# Patient Record
Sex: Male | Born: 1960 | Race: White | Hispanic: No | Smoking: Current every day smoker
Health system: Southern US, Community
[De-identification: ages and names within clinical notes are randomized; demographics above are authoritative.]

## PROBLEM LIST (undated history)

## (undated) HISTORY — PX: SKIN GRAFT: SHX250

---

## 2007-06-09 ENCOUNTER — Emergency Department (HOSPITAL_COMMUNITY): Admission: EM | Admit: 2007-06-09 | Discharge: 2007-06-09 | Payer: Self-pay | Admitting: Emergency Medicine

## 2010-08-25 ENCOUNTER — Emergency Department: Payer: Self-pay | Admitting: Internal Medicine

## 2015-01-14 ENCOUNTER — Emergency Department: Payer: Self-pay

## 2015-01-14 ENCOUNTER — Encounter: Payer: Self-pay | Admitting: Emergency Medicine

## 2015-01-14 ENCOUNTER — Inpatient Hospital Stay: Payer: Self-pay

## 2015-01-14 ENCOUNTER — Inpatient Hospital Stay
Admission: EM | Admit: 2015-01-14 | Discharge: 2015-01-18 | DRG: 208 | Disposition: A | Discharge status: Home / Self Care | Payer: Self-pay | Attending: Internal Medicine | Admitting: Internal Medicine

## 2015-01-14 DIAGNOSIS — Z0189 Encounter for other specified special examinations: Secondary | ICD-10-CM

## 2015-01-14 DIAGNOSIS — K7010 Alcoholic hepatitis without ascites: Secondary | ICD-10-CM | POA: Diagnosis present

## 2015-01-14 DIAGNOSIS — S0191XA Laceration without foreign body of unspecified part of head, initial encounter: Secondary | ICD-10-CM | POA: Diagnosis present

## 2015-01-14 DIAGNOSIS — F17210 Nicotine dependence, cigarettes, uncomplicated: Secondary | ICD-10-CM | POA: Diagnosis present

## 2015-01-14 DIAGNOSIS — G9340 Encephalopathy, unspecified: Secondary | ICD-10-CM | POA: Diagnosis present

## 2015-01-14 DIAGNOSIS — R0681 Apnea, not elsewhere classified: Secondary | ICD-10-CM

## 2015-01-14 DIAGNOSIS — F10121 Alcohol abuse with intoxication delirium: Secondary | ICD-10-CM | POA: Diagnosis present

## 2015-01-14 DIAGNOSIS — E871 Hypo-osmolality and hyponatremia: Secondary | ICD-10-CM | POA: Diagnosis present

## 2015-01-14 DIAGNOSIS — E860 Dehydration: Secondary | ICD-10-CM | POA: Diagnosis present

## 2015-01-14 DIAGNOSIS — F10929 Alcohol use, unspecified with intoxication, unspecified: Secondary | ICD-10-CM | POA: Diagnosis present

## 2015-01-14 DIAGNOSIS — R451 Restlessness and agitation: Secondary | ICD-10-CM | POA: Diagnosis present

## 2015-01-14 DIAGNOSIS — J9811 Atelectasis: Secondary | ICD-10-CM | POA: Diagnosis present

## 2015-01-14 DIAGNOSIS — R740 Nonspecific elevation of levels of transaminase and lactic acid dehydrogenase [LDH]: Secondary | ICD-10-CM | POA: Diagnosis present

## 2015-01-14 DIAGNOSIS — Y908 Blood alcohol level of 240 mg/100 ml or more: Secondary | ICD-10-CM | POA: Diagnosis present

## 2015-01-14 DIAGNOSIS — J9601 Acute respiratory failure with hypoxia: Principal | ICD-10-CM | POA: Diagnosis present

## 2015-01-14 DIAGNOSIS — W19XXXA Unspecified fall, initial encounter: Secondary | ICD-10-CM | POA: Diagnosis present

## 2015-01-14 DIAGNOSIS — F10129 Alcohol abuse with intoxication, unspecified: Secondary | ICD-10-CM

## 2015-01-14 DIAGNOSIS — I959 Hypotension, unspecified: Secondary | ICD-10-CM | POA: Diagnosis present

## 2015-01-14 DIAGNOSIS — R410 Disorientation, unspecified: Secondary | ICD-10-CM | POA: Diagnosis present

## 2015-01-14 LAB — BLOOD GAS, ARTERIAL
Acid-base deficit: 3.7 mmol/L — ABNORMAL HIGH (ref 0.0–2.0)
Acid-base deficit: 0.6 mmol/L (ref 0.0–2.0)
Allens test (pass/fail): POSITIVE — AB
BICARBONATE: 22.1 meq/L (ref 21.0–28.0)
BICARBONATE: 25.4 meq/L (ref 21.0–28.0)
FIO2: 0.35
FIO2: 0.35
LHR: 14 {breaths}/min
MECHANICAL RATE: 14
MECHVT: 500 mL
MECHVT: 500 mL
O2 SAT: 99.5 %
O2 Saturation: 98.8 %
PATIENT TEMPERATURE: 37
PCO2 ART: 42 mmHg (ref 32.0–48.0)
PEEP/CPAP: 5 cmH2O
PEEP/CPAP: 5 cmH2O
PO2 ART: 173 mmHg — AB (ref 83.0–108.0)
Patient temperature: 37
RATE: 14 resp/min
pCO2 arterial: 46 mmHg (ref 32.0–48.0)
pH, Arterial: 7.33 — ABNORMAL LOW (ref 7.350–7.450)
pH, Arterial: 7.35 (ref 7.350–7.450)
pO2, Arterial: 130 mmHg — ABNORMAL HIGH (ref 83.0–108.0)

## 2015-01-14 LAB — COMPREHENSIVE METABOLIC PANEL
ALT: 96 U/L — AB (ref 17–63)
AST: 77 U/L — AB (ref 15–41)
Albumin: 4.3 g/dL (ref 3.5–5.0)
Alkaline Phosphatase: 135 U/L — ABNORMAL HIGH (ref 38–126)
Anion gap: 8 (ref 5–15)
BUN: 12 mg/dL (ref 6–20)
CHLORIDE: 103 mmol/L (ref 101–111)
CO2: 23 mmol/L (ref 22–32)
CREATININE: 1.09 mg/dL (ref 0.61–1.24)
Calcium: 8.5 mg/dL — ABNORMAL LOW (ref 8.9–10.3)
GFR calc Af Amer: >60 mL/min (ref >60)
GFR calc non Af Amer: >60 mL/min (ref >60)
Glucose, Bld: 105 mg/dL — ABNORMAL HIGH (ref 65–99)
Potassium: 4 mmol/L (ref 3.5–5.1)
SODIUM: 134 mmol/L — AB (ref 135–145)
Total Bilirubin: 0.3 mg/dL (ref 0.3–1.2)
Total Protein: 8.7 g/dL — ABNORMAL HIGH (ref 6.5–8.1)

## 2015-01-14 LAB — CBC WITH DIFFERENTIAL/PLATELET
BASOS ABS: 0.1 10*3/uL (ref 0–0.1)
BASOS PCT: 1 %
EOS ABS: 0.4 10*3/uL (ref 0–0.7)
EOS PCT: 5 %
HCT: 40.3 % (ref 40.0–52.0)
Hemoglobin: 14.1 g/dL (ref 13.0–18.0)
LYMPHS PCT: 39 %
Lymphs Abs: 3.3 10*3/uL (ref 1.0–3.6)
MCH: 33.2 pg (ref 26.0–34.0)
MCHC: 35 g/dL (ref 32.0–36.0)
MCV: 95 fL (ref 80.0–100.0)
Monocytes Absolute: 0.8 10*3/uL (ref 0.2–1.0)
Monocytes Relative: 9 %
Neutro Abs: 3.8 10*3/uL (ref 1.4–6.5)
Neutrophils Relative %: 46 %
PLATELETS: 131 10*3/uL — AB (ref 150–440)
RBC: 4.25 MIL/uL — AB (ref 4.40–5.90)
RDW: 12.5 % (ref 11.5–14.5)
WBC: 8.4 10*3/uL (ref 3.8–10.6)

## 2015-01-14 LAB — URINALYSIS COMPLETE WITH MICROSCOPIC (ARMC ONLY)
BILIRUBIN URINE: NEGATIVE
GLUCOSE, UA: NEGATIVE mg/dL
KETONES UR: NEGATIVE mg/dL
Nitrite: NEGATIVE
PH: 6 (ref 5.0–8.0)
Protein, ur: NEGATIVE mg/dL
RBC / HPF: NONE SEEN RBC/hpf (ref 0–5)
SQUAMOUS EPITHELIAL / LPF: NONE SEEN
Specific Gravity, Urine: 1.003 — ABNORMAL LOW (ref 1.005–1.030)

## 2015-01-14 LAB — MRSA PCR SCREENING: MRSA BY PCR: NEGATIVE

## 2015-01-14 LAB — AMMONIA: AMMONIA: 30 umol/L (ref 9–35)

## 2015-01-14 LAB — TRIGLYCERIDES: Triglycerides: 179 mg/dL — ABNORMAL HIGH (ref <150)

## 2015-01-14 LAB — ETHANOL: Alcohol, Ethyl (B): 397 mg/dL (ref <5)

## 2015-01-14 LAB — MAGNESIUM: MAGNESIUM: 2.1 mg/dL (ref 1.7–2.4)

## 2015-01-14 LAB — LIPASE, BLOOD: LIPASE: 39 U/L (ref 22–51)

## 2015-01-14 LAB — PHOSPHORUS: Phosphorus: 4.3 mg/dL (ref 2.5–4.6)

## 2015-01-14 MED ORDER — ACETAMINOPHEN 325 MG PO TABS
650.0000 mg | ORAL_TABLET | Freq: Four times a day (QID) | ORAL | Status: DC | PRN
  Administered 2015-01-16: 650 mg via ORAL
  Filled 2015-01-14: qty 2

## 2015-01-14 MED ORDER — LORAZEPAM 2 MG/ML IJ SOLN
2.0000 mg | Freq: Once | INTRAMUSCULAR | Status: AC
  Administered 2015-01-14: 2 mg via INTRAVENOUS

## 2015-01-14 MED ORDER — SODIUM CHLORIDE 0.9 % IV BOLUS (SEPSIS): 1000.0000 mL | Freq: Once | INTRAVENOUS | Status: DC

## 2015-01-14 MED ORDER — PANTOPRAZOLE SODIUM 40 MG IV SOLR: 40.0000 mg | Freq: Every day | INTRAVENOUS | Status: DC

## 2015-01-14 MED ORDER — ONDANSETRON HCL 4 MG/2ML IJ SOLN: 4.0000 mg | Freq: Four times a day (QID) | INTRAMUSCULAR | Status: DC | PRN

## 2015-01-14 MED ORDER — LORAZEPAM 2 MG/ML IJ SOLN
2.0000 mg | INTRAMUSCULAR | Status: DC | PRN
  Administered 2015-01-14 – 2015-01-15 (×5): 2 mg via INTRAVENOUS
  Filled 2015-01-14 (×5): qty 1

## 2015-01-14 MED ORDER — ANTISEPTIC ORAL RINSE SOLUTION (CORINZ)
7.0000 mL | OROMUCOSAL | Status: DC
Start: 2015-01-14 — End: 2015-01-15
  Administered 2015-01-14 – 2015-01-15 (×11): 7 mL via OROMUCOSAL
  Filled 2015-01-14 (×20): qty 7

## 2015-01-14 MED ORDER — PROPOFOL 10 MG/ML IV BOLUS
0.5000 mg/kg | Freq: Once | INTRAVENOUS | Status: AC
  Administered 2015-01-14: 06:00:00 via INTRAVENOUS

## 2015-01-14 MED ORDER — ACETAMINOPHEN 650 MG RE SUPP: 650.0000 mg | Freq: Four times a day (QID) | RECTAL | Status: DC | PRN

## 2015-01-14 MED ORDER — HALOPERIDOL LACTATE 5 MG/ML IJ SOLN
INTRAMUSCULAR | Status: AC
  Administered 2015-01-14: 5 mg via INTRAVENOUS
  Filled 2015-01-14: qty 1

## 2015-01-14 MED ORDER — HALOPERIDOL LACTATE 5 MG/ML IJ SOLN
5.0000 mg | Freq: Once | INTRAMUSCULAR | Status: AC
  Administered 2015-01-14: 5 mg via INTRAVENOUS

## 2015-01-14 MED ORDER — PROPOFOL 1000 MG/100ML IV EMUL: 5.0000 ug/kg/min | INTRAVENOUS | Status: DC

## 2015-01-14 MED ORDER — ETOMIDATE 2 MG/ML IV SOLN
20.0000 mg | Freq: Once | INTRAVENOUS | Status: AC
  Administered 2015-01-14: 20 mg via INTRAVENOUS

## 2015-01-14 MED ORDER — SUCCINYLCHOLINE CHLORIDE 20 MG/ML IJ SOLN
200.0000 mg | Freq: Once | INTRAMUSCULAR | Status: AC
Start: 2015-01-14 — End: 2015-01-14
  Administered 2015-01-14: 200 mg via INTRAVENOUS

## 2015-01-14 MED ORDER — SODIUM CHLORIDE 0.9 % IV BOLUS (SEPSIS)
1000.0000 mL | Freq: Once | INTRAVENOUS | Status: AC
  Administered 2015-01-14: 1000 mL via INTRAVENOUS

## 2015-01-14 MED ORDER — PROPOFOL 1000 MG/100ML IV EMUL
5.0000 ug/kg/min | INTRAVENOUS | Status: DC
  Administered 2015-01-14: 40 ug/kg/min via INTRAVENOUS
  Administered 2015-01-14: 50 ug/kg/min via INTRAVENOUS
  Administered 2015-01-14: 60 ug/kg/min via INTRAVENOUS
  Administered 2015-01-14: 35 ug/kg/min via INTRAVENOUS
  Administered 2015-01-15: 60 ug/kg/min via INTRAVENOUS
  Filled 2015-01-14 (×5): qty 100

## 2015-01-14 MED ORDER — FENTANYL CITRATE (PF) 100 MCG/2ML IJ SOLN
100.0000 ug | INTRAMUSCULAR | Status: DC | PRN
  Administered 2015-01-14 – 2015-01-15 (×3): 100 ug via INTRAVENOUS
  Filled 2015-01-14 (×3): qty 2

## 2015-01-14 MED ORDER — SODIUM CHLORIDE 0.9 % IJ SOLN
3.0000 mL | Freq: Two times a day (BID) | INTRAMUSCULAR | Status: DC
  Administered 2015-01-14 – 2015-01-17 (×7): 3 mL via INTRAVENOUS

## 2015-01-14 MED ORDER — FENTANYL 2500MCG IN NS 250ML (10MCG/ML) PREMIX INFUSION: 10.0000 ug/h | INTRAVENOUS | Status: DC

## 2015-01-14 MED ORDER — DEXMEDETOMIDINE HCL IN NACL 400 MCG/100ML IV SOLN
0.2000 ug/kg/h | INTRAVENOUS | Status: DC
  Filled 2015-01-14: qty 50

## 2015-01-14 MED ORDER — LORAZEPAM 2 MG/ML IJ SOLN
0.5000 mg | Freq: Once | INTRAMUSCULAR | Status: AC
  Administered 2015-01-14: 0.5 mg via INTRAVENOUS
  Filled 2015-01-14: qty 1

## 2015-01-14 MED ORDER — ENOXAPARIN SODIUM 40 MG/0.4ML ~~LOC~~ SOLN
40.0000 mg | SUBCUTANEOUS | Status: DC
  Administered 2015-01-14 – 2015-01-18 (×5): 40 mg via SUBCUTANEOUS
  Filled 2015-01-14 (×5): qty 0.4

## 2015-01-14 MED ORDER — LORAZEPAM 2 MG/ML IJ SOLN
INTRAMUSCULAR | Status: AC
  Administered 2015-01-14: 2 mg via INTRAVENOUS
  Filled 2015-01-14: qty 1

## 2015-01-14 MED ORDER — PANTOPRAZOLE SODIUM 40 MG IV SOLR
40.0000 mg | Freq: Every day | INTRAVENOUS | Status: DC
  Administered 2015-01-15 – 2015-01-17 (×3): 40 mg via INTRAVENOUS
  Filled 2015-01-14 (×4): qty 40

## 2015-01-14 MED ORDER — THIAMINE HCL 100 MG/ML IJ SOLN
Freq: Once | INTRAVENOUS | Status: AC
  Administered 2015-01-14: 13:00:00 via INTRAVENOUS
  Filled 2015-01-14: qty 1000

## 2015-01-14 MED ORDER — ONDANSETRON HCL 4 MG PO TABS: 4.0000 mg | ORAL_TABLET | Freq: Four times a day (QID) | ORAL | Status: DC | PRN

## 2015-01-14 MED ORDER — PROPOFOL 1000 MG/100ML IV EMUL
INTRAVENOUS | Status: AC
  Filled 2015-01-14: qty 100

## 2015-01-14 MED ORDER — CHLORHEXIDINE GLUCONATE 0.12% ORAL RINSE (MEDLINE KIT)
15.0000 mL | Freq: Two times a day (BID) | OROMUCOSAL | Status: DC
  Administered 2015-01-14 – 2015-01-15 (×3): 15 mL via OROMUCOSAL
  Filled 2015-01-14 (×2): qty 15

## 2015-01-14 MED ORDER — TETANUS-DIPHTH-ACELL PERTUSSIS 5-2.5-18.5 LF-MCG/0.5 IM SUSP: 0.5000 mL | Freq: Once | INTRAMUSCULAR | Status: DC

## 2015-01-14 NOTE — ED Notes (Signed)
Pt with longer periods of apnea, sats decreased to 45%;  No gag reflex noted; NRB placed at 100%; ambu bag at bedside; BP 84/49; MD notified

## 2015-01-14 NOTE — ED Notes (Signed)
Ret to room 5 for closer observation to nurses desk; pt with eyes closed, occasionally attempting to sit up in bed then fall back supine with snoring respiration

## 2015-01-14 NOTE — ED Notes (Signed)
Nurse Bill in room with patient at this time.

## 2015-01-14 NOTE — ED Notes (Signed)
Pt now resting quietly; resp even/unlab; transported to xray and CT

## 2015-01-14 NOTE — ED Notes (Addendum)
Pt uprite on stretcher in exam room with no distress noted; pt talkative and cooperative; admits to drinking 12pack tonight and falling few times; denies any c/o pain at present; pt instructed to call for assistance in getting OOB; pt voices good understanding; abrasion noted to occipitut with scant bleeding; site clensed; no c/o pain or tenderness with palpation of neck or back; pt A&Ox3, MAEW, PERRL; resp even/unlab; no other injuries noted or reported; pt admits to drinking ETOH daily and is homeless; denies desire to seek detox at this time; fall bracelet and yellow fall slippers placed on pt for alert an call bell in reach; c-collar placed

## 2015-01-14 NOTE — ED Notes (Signed)
When assessing pt, propofol gtt @ 44mcg/kg/min. This is more than ordered dose. Dr sung notified. Gtt decreased to 28mcg/kg/min due to bp. See note. MAR changed to refect current dose.

## 2015-01-14 NOTE — Progress Notes (Addendum)
[facility] - [place] at [facility]   PATIENT NAME: Aaron Roach    MR#:  [id number]  DATE OF BIRTH:  [birth year]  SUBJECTIVE:  CHIEF COMPLAINT:   Chief Complaint  Patient presents with  . Fall    Pt. here via EMS from home for a fall laceration to top of head.   patient is 54 year old Caucasian male with history of alcohol abuse who was brought by EMS for fall and laceration of the top of his head. CT scan of the head and neck were unremarkable. As patient was agitated. He received a few small doses of Ativan as well as Haldol and became lethargic, hypoxic , apneic and was intubated. Remains intubated and off sedation. However, not able to follow commands, not opening eyes and is agitated, reaching to the tube site. Sedation was restarted.   Review of Systems  Unable to perform ROS: intubated    VITAL SIGNS: Blood pressure 132/83, pulse 75, temperature 99 F (37.2 C), temperature source Oral, resp. rate 14, height 5\' 6"  (1.676 m), weight 63.504 kg (140 lb), SpO2 100 %.  PHYSICAL EXAMINATION:   GENERAL:  54 y.o.-year-old patient lying in the bed in mild to moderate distress, not opening eyes, does not follow commands, however, able to go lift up his arms reach up to the tube was attempted to take it out.  EYES: Pupils equal, round, reactive to light and accommodation. No scleral icterus. Extraocular muscles intact.  HEENT: Head reveals laceration on the vertex, no bleeding, some dry blood was noted, normocephalic. Oropharynx and nasopharynx clear. ET tube is in place NECK:  Supple, no jugular venous distention. No thyroid enlargement, no tenderness.  LUNGS: Slightly diminished breath sounds bilaterally, no wheezing, bilateral rales,rhonchi  in all lung fields but no crepitations. ET tube secretions are thin and clear.  Intermittently using accessory muscles of respiration, especially when agitated.  CARDIOVASCULAR: S1, S2 normal. No murmurs, rubs, or gallops.   ABDOMEN: Soft, nontender, nondistended. Bowel sounds present. No organomegaly or mass.  EXTREMITIES: No pedal edema, cyanosis, or clubbing.  NEUROLOGIC: Cranial nerves II through XII are intact grossly. Muscle strength 5/5 in upper extremities, not able to evaluated lower extremities due to poor mobility. Sensation not able to evaluate. Gait not checked.  PSYCHIATRIC: The patient is somnolent , does not open eyes. Does not verbalize agitated intermittently, does not follow commands , not able to assess orientation .  SKIN: No obvious rash, lesion, or ulcer.   ORDERS/RESULTS REVIEWED:   CBC  Recent Labs Lab 01/14/15 0230  WBC 8.4  HGB 14.1  HCT 40.3  PLT 131*  MCV 95.0  MCH 33.2  MCHC 35.0  RDW 12.5  LYMPHSABS 3.3  MONOABS 0.8  EOSABS 0.4  BASOSABS 0.1   ------------------------------------------------------------------------------------------------------------------  Chemistries   Recent Labs Lab 01/14/15 0230  NA 134*  K 4.0  CL 103  CO2 23  GLUCOSE 105*  BUN 12  CREATININE 1.09  CALCIUM 8.5*  MG 2.1  AST 77*  ALT 96*  ALKPHOS 135*  BILITOT 0.3   ------------------------------------------------------------------------------------------------------------------ estimated creatinine clearance is 69.6 mL/min (by C-G formula based on Cr of 1.09). ------------------------------------------------------------------------------------------------------------------ No results for input(s): TSH, T4TOTAL, T3FREE, THYROIDAB in the last 72 hours.  Invalid input(s): FREET3  Cardiac Enzymes No results for input(s): CKMB, TROPONINI, MYOGLOBIN in the last 168 hours.  Invalid input(s): CK ------------------------------------------------------------------------------------------------------------------ Invalid input(s): POCBNP ---------------------------------------------------------------------------------------------------------------  RADIOLOGY: Dg Lumbar Spine  Complete  01/14/2015   CLINICAL DATA:  Low back  pain after multiple falls while drinking.  EXAM: LUMBAR SPINE - COMPLETE 4+ VIEW  COMPARISON:  None.  FINDINGS: Normal alignment of the lumbar spine. No vertebral compression deformities. Diffuse degenerative change with narrowed disc space heights and endplate hypertrophic changes. Degenerative disc disease at L4-5 and L5-S1. No focal bone lesion or bone destruction.  IMPRESSION: Degenerative changes in the lumbar spine. Normal alignment. No acute displaced fractures identified.   Electronically Signed   By: Burman Nieves M.D.   On: 01/14/2015 04:26   Dg Abd 1 View  01/14/2015   CLINICAL DATA:  54 year old male status post enteric tube placement. Initial encounter.  EXAM: ABDOMEN - 1 VIEW  COMPARISON:  Lumbar radiographs 0356 hours today.  FINDINGS: AP supine view of the abdomen at 1154 hours. Enteric tube placed, side hole at the level of the gastric fundus. Stable, negative bowel gas pattern. Negative visualized lung bases. Stable visualized osseous structures.  IMPRESSION: Enteric tube side hole at the level of the proximal stomach.   Electronically Signed   By: Odessa Fleming M.D.   On: 01/14/2015 12:24   Ct Head Wo Contrast  01/14/2015   CLINICAL DATA:  Fall with head injury.  Intoxicated.  EXAM: CT HEAD WITHOUT CONTRAST  CT CERVICAL SPINE WITHOUT CONTRAST  TECHNIQUE: Multidetector CT imaging of the head and cervical spine was performed following the standard protocol without intravenous contrast. Multiplanar CT image reconstructions of the cervical spine were also generated.  COMPARISON:  None.  FINDINGS: CT HEAD FINDINGS  Examination is technically limited due to motion artifact. Ventricles and sulci appear symmetrical. No mass effect or midline shift. No abnormal extra-axial fluid collections. Gray-white matter junctions are distinct. Basal cisterns are not effaced. No evidence of acute intracranial hemorrhage. No depressed skull fractures. Near complete  opacification of the right maxillary antrum.  CT CERVICAL SPINE FINDINGS  Examination is limited due to motion artifact. Normal alignment of the cervical spine. Degenerative changes with narrowed interspaces and endplate hypertrophic changes predominantly at C3-4, C4-5, and C5-6 levels. No vertebral compression deformities. No prevertebral soft tissue swelling. C1-2 articulation appears intact. No focal bone lesion or bone destruction. Bone cortex and trabecular architecture appear intact. Vascular calcifications in the cervical carotid arteries.  IMPRESSION: No acute intracranial abnormalities. Opacification of right maxillary antrum.  Normal alignment of the cervical spine. Degenerative changes. No acute displaced fractures identified.   Electronically Signed   By: Burman Nieves M.D.   On: 01/14/2015 04:38   Ct Cervical Spine Wo Contrast  01/14/2015   CLINICAL DATA:  Fall with head injury.  Intoxicated.  EXAM: CT HEAD WITHOUT CONTRAST  CT CERVICAL SPINE WITHOUT CONTRAST  TECHNIQUE: Multidetector CT imaging of the head and cervical spine was performed following the standard protocol without intravenous contrast. Multiplanar CT image reconstructions of the cervical spine were also generated.  COMPARISON:  None.  FINDINGS: CT HEAD FINDINGS  Examination is technically limited due to motion artifact. Ventricles and sulci appear symmetrical. No mass effect or midline shift. No abnormal extra-axial fluid collections. Gray-white matter junctions are distinct. Basal cisterns are not effaced. No evidence of acute intracranial hemorrhage. No depressed skull fractures. Near complete opacification of the right maxillary antrum.  CT CERVICAL SPINE FINDINGS  Examination is limited due to motion artifact. Normal alignment of the cervical spine. Degenerative changes with narrowed interspaces and endplate hypertrophic changes predominantly at C3-4, C4-5, and C5-6 levels. No vertebral compression deformities. No prevertebral  soft tissue swelling. C1-2 articulation appears intact. No focal bone  lesion or bone destruction. Bone cortex and trabecular architecture appear intact. Vascular calcifications in the cervical carotid arteries.  IMPRESSION: No acute intracranial abnormalities. Opacification of right maxillary antrum.  Normal alignment of the cervical spine. Degenerative changes. No acute displaced fractures identified.   Electronically Signed   By: Burman Nieves M.D.   On: 01/14/2015 04:38   Dg Chest Port 1 View  01/14/2015   CLINICAL DATA:  Post intubation.  EXAM: PORTABLE CHEST - 1 VIEW  COMPARISON:  None.  FINDINGS: Endotracheal tube tip projects above the level of the clavicles, 8.4 cm above the carina. Nasogastric tube past the GE junction, side port projecting at or just below the GE junction. Cardiomediastinal silhouette is normal. The lungs are clear without pleural effusions or focal consolidations. Bilateral lung base strandy densities. Trachea projects midline and there is no pneumothorax. Soft tissue planes and included osseous structures are non-suspicious.  IMPRESSION: Endotracheal tube tip projects 8.4 cm above the carina, recommend at least 3 cm of advancement. Nasogastric tube past GE junction.  Bilateral lung base atelectasis/scarring.   Electronically Signed   By: Awilda Metro M.D.   On: 01/14/2015 06:47    EKG: No orders found for this or any previous visit.  ASSESSMENT AND PLAN:  Principal Problem:   Acute respiratory failure with hypoxemia Active Problems:   Alcohol intoxication   Acute delirium 1. Acute delirium  due to alcohol intoxication, now comatose , watch for DTs as patient is being managed in the hospital, supportive therapy, get ammonia level to rule out hepatic encephalopathy 2. Acute respiratory failure with hypoxia, likely due to alcohol intoxication in combination with sedating medications, continue sedation with propofol per pulmonologist's recommendations. Continue  mechanical ventilation. Follow patient's mental status closely and extubate him as possible. Chest x-ray showed no congestive heart failure or pneumonia, but atelectasis and scarring 3. Hyponatremia, questionable  Chronic, as patient's urine specific gravity is low,  follow tomorrow in the morning, continue low rate IV fluids 4. Elevated transaminases, likely alcoholic hepatitis versus infectious hepatitis, follow liver function test in the morning. Patient's lipase level is normal, get ammonia level .    Management plans discussed with the patient, family and they are in agreement.   DRUG ALLERGIES: No Known Allergies  CODE STATUS:     Code Status Orders        Start     Ordered   01/14/15 0901  Full code   Continuous     01/14/15 0900      TOTAL CRITICAL CARE TIME TAKING CARE OF THIS PATIENT: 45  minutes.  The patient's management was discussed with pulmonologist/ intensivist, Dr. Belia Heman.   Katharina Caper M.D on 01/14/2015 at 1:48 PM  Between 7am to 6pm - Pager - [phone]  After 6pm go to [email or web address] - password [email or web address] [facility]  [place] [facility]  Office  [phone]  CC: Primary care physician; No primary care provider on file.

## 2015-01-14 NOTE — Consult Note (Signed)
[place] [facility] Consultation      Name: Aaron Roach MRN: [id number] DOB: 03/25/1961    ADMISSION DATE:  01/14/2015   CHIEF COMPLAINT:   resp failure   HISTORY OF PRESENT ILLNESS   54 yo white admitted to ICU for acute resp failure, ETOH level above 350, patient with acute mental status changes found to  Be hypoxic with low blood pressure, patient intubated and placed on vent support  Supposedly patient was combative and was given several doses of ativan at which point patient became hypoxic  Patient currently sedated, GCS<8T  S/p head laceration to top of head       SIGNIFICANT EVENTS    Intubated 9/5    PAST MEDICAL HISTORY    :  History reviewed. No pertinent past medical history. Past Surgical History  Procedure Laterality Date  . Skin graft     Prior to Admission medications   Not on File   No Known Allergies   FAMILY HISTORY   History reviewed. No pertinent family history.    SOCIAL HISTORY    reports that he has been smoking Cigarettes.  He has been smoking about 0.50 packs per day. He does not have any smokeless tobacco history on file. He reports that he drinks about 3.6 oz of alcohol per week. He reports that he does not use illicit drugs.  Review of Systems  Unable to perform ROS: critical illness      VITAL SIGNS    Temp:  [97.6 F (36.4 C)-98.3 F (36.8 C)] 98.3 F (36.8 C) (09/05 0900) Pulse Rate:  [63-97] 80 (09/05 0900) Resp:  [13-22] 13 (09/05 0900) BP: (84-153)/(49-105) 124/77 mmHg (09/05 0900) SpO2:  [94 %-100 %] 100 % (09/05 0900) FiO2 (%):  [35 %] 35 % (09/05 0831) Weight:  [140 lb (63.504 kg)] 140 lb (63.504 kg) (09/05 0137) HEMODYNAMICS:   VENTILATOR SETTINGS: Vent Mode:  [-] AC FiO2 (%):  [35 %] 35 % Set Rate:  [14 bmp] 14 bmp Vt Set:  [500 mL] 500 mL PEEP:  [5 cmH20] 5 cmH20 INTAKE / OUTPUT:  Intake/Output Summary (Last 24 hours) at 01/14/15 0937 Last data filed at 01/14/15 0608  Gross per 24 hour  Intake      0 ml  Output   1100 ml  Net  -1100 ml       PHYSICAL EXAM   Physical Exam  Constitutional: He appears distressed.  HENT:  Head: Normocephalic and atraumatic.  Eyes: Pupils are equal, round, and reactive to light. No scleral icterus.  Neck: Normal range of motion. Neck supple.  Cardiovascular: Normal rate and regular rhythm.   No murmur heard. Pulmonary/Chest: No respiratory distress. He has no wheezes. He has no rales.  resp distress  Abdominal: Soft. He exhibits no distension. There is no tenderness.  Musculoskeletal: He exhibits no edema.  Neurological: He displays normal reflexes. Coordination normal.  gcs<8T  Skin: Skin is warm. No rash noted. He is diaphoretic.       LABS   LABS:  CBC  Recent Labs Lab 01/14/15 0230  WBC 8.4  HGB 14.1  HCT 40.3  PLT 131*   Coag's No results for input(s): APTT, INR in the last 168 hours. BMET  Recent Labs Lab 01/14/15 0230  NA 134*  K 4.0  CL 103  CO2 23  BUN 12  CREATININE 1.09  GLUCOSE 105*   Electrolytes  Recent Labs Lab 01/14/15 0230  CALCIUM 8.5*  MG 2.1  PHOS  4.3   Sepsis Markers No results for input(s): LATICACIDVEN, PROCALCITON, O2SATVEN in the last 168 hours. ABG  Recent Labs Lab 01/14/15 0605  PHART 7.33*  PCO2ART 42  PO2ART 173*   Liver Enzymes  Recent Labs Lab 01/14/15 0230  AST 77*  ALT 96*  ALKPHOS 135*  BILITOT 0.3  ALBUMIN 4.3   Cardiac Enzymes No results for input(s): TROPONINI, PROBNP in the last 168 hours. Glucose No results for input(s): GLUCAP in the last 168 hours.   No results found for this or any previous visit (from the past 240 hour(s)).   Current facility-administered medications:  .  acetaminophen (TYLENOL) tablet 650 mg, 650 mg, Oral, Q6H PRN **OR** acetaminophen (TYLENOL) suppository 650 mg, 650 mg, Rectal, Q6H PRN, Lance Coon, MD .  antiseptic oral rinse solution (CORINZ), 7 mL, Mouth Rinse, 10 times per day, Lance Coon, MD .  chlorhexidine gluconate (PERIDEX) 0.12 % solution 15 mL, 15 mL, Mouth Rinse, BID, Lance Coon, MD .  dexmedetomidine (PRECEDEX) 400 MCG/100ML (4 mcg/mL) infusion, 0.2-0.7 mcg/kg/hr, Intravenous, Continuous, Lance Coon, MD .  enoxaparin (LOVENOX) injection 40 mg, 40 mg, Subcutaneous, Q24H, Lance Coon, MD .  fentaNYL 2547mcg in NS 260mL (43mcg/ml) infusion-PREMIX, 10 mcg/hr, Intravenous, Continuous, Lance Coon, MD .  ondansetron (ZOFRAN) tablet 4 mg, 4 mg, Oral, Q6H PRN **OR** ondansetron (ZOFRAN) injection 4 mg, 4 mg, Intravenous, Q6H PRN, Lance Coon, MD .  pantoprazole (PROTONIX) injection 40 mg, 40 mg, Intravenous, Daily, Lance Coon, MD .  sodium chloride 0.9 % 1,000 mL with thiamine 409 mg, folic acid 1 mg, multivitamins adult 10 mL infusion, , Intravenous, Once, Lance Coon, MD .  sodium chloride 0.9 % injection 3 mL, 3 mL, Intravenous, Q12H, Lance Coon, MD  IMAGING    Dg Lumbar Spine Complete  01/14/2015   CLINICAL DATA:  Low back pain after multiple falls while drinking.  EXAM: LUMBAR SPINE - COMPLETE 4+ VIEW  COMPARISON:  None.  FINDINGS: Normal alignment of the lumbar spine. No vertebral compression deformities. Diffuse degenerative change with narrowed disc space heights and endplate hypertrophic changes. Degenerative disc disease at L4-5 and L5-S1. No focal bone lesion or bone destruction.  IMPRESSION: Degenerative changes in the lumbar spine. Normal alignment. No acute displaced fractures identified.   Electronically Signed   By: Lucienne Capers M.D.   On: 01/14/2015 04:26   Ct Head Wo Contrast  01/14/2015   CLINICAL DATA:  Fall with head injury.  Intoxicated.  EXAM: CT HEAD WITHOUT CONTRAST  CT CERVICAL SPINE WITHOUT CONTRAST  TECHNIQUE: Multidetector CT imaging of the head and cervical spine was performed following the standard protocol without intravenous contrast. Multiplanar CT image reconstructions of the cervical spine were also generated.  COMPARISON:   None.  FINDINGS: CT HEAD FINDINGS  Examination is technically limited due to motion artifact. Ventricles and sulci appear symmetrical. No mass effect or midline shift. No abnormal extra-axial fluid collections. Gray-white matter junctions are distinct. Basal cisterns are not effaced. No evidence of acute intracranial hemorrhage. No depressed skull fractures. Near complete opacification of the right maxillary antrum.  CT CERVICAL SPINE FINDINGS  Examination is limited due to motion artifact. Normal alignment of the cervical spine. Degenerative changes with narrowed interspaces and endplate hypertrophic changes predominantly at C3-4, C4-5, and C5-6 levels. No vertebral compression deformities. No prevertebral soft tissue swelling. C1-2 articulation appears intact. No focal bone lesion or bone destruction. Bone cortex and trabecular architecture appear intact. Vascular calcifications in the cervical carotid arteries.  IMPRESSION: No  acute intracranial abnormalities. Opacification of right maxillary antrum.  Normal alignment of the cervical spine. Degenerative changes. No acute displaced fractures identified.   Electronically Signed   By: Lucienne Capers M.D.   On: 01/14/2015 04:38   Ct Cervical Spine Wo Contrast  01/14/2015   CLINICAL DATA:  Fall with head injury.  Intoxicated.  EXAM: CT HEAD WITHOUT CONTRAST  CT CERVICAL SPINE WITHOUT CONTRAST  TECHNIQUE: Multidetector CT imaging of the head and cervical spine was performed following the standard protocol without intravenous contrast. Multiplanar CT image reconstructions of the cervical spine were also generated.  COMPARISON:  None.  FINDINGS: CT HEAD FINDINGS  Examination is technically limited due to motion artifact. Ventricles and sulci appear symmetrical. No mass effect or midline shift. No abnormal extra-axial fluid collections. Gray-white matter junctions are distinct. Basal cisterns are not effaced. No evidence of acute intracranial hemorrhage. No depressed  skull fractures. Near complete opacification of the right maxillary antrum.  CT CERVICAL SPINE FINDINGS  Examination is limited due to motion artifact. Normal alignment of the cervical spine. Degenerative changes with narrowed interspaces and endplate hypertrophic changes predominantly at C3-4, C4-5, and C5-6 levels. No vertebral compression deformities. No prevertebral soft tissue swelling. C1-2 articulation appears intact. No focal bone lesion or bone destruction. Bone cortex and trabecular architecture appear intact. Vascular calcifications in the cervical carotid arteries.  IMPRESSION: No acute intracranial abnormalities. Opacification of right maxillary antrum.  Normal alignment of the cervical spine. Degenerative changes. No acute displaced fractures identified.   Electronically Signed   By: Lucienne Capers M.D.   On: 01/14/2015 04:38   Dg Chest Port 1 View  01/14/2015   CLINICAL DATA:  Post intubation.  EXAM: PORTABLE CHEST - 1 VIEW  COMPARISON:  None.  FINDINGS: Endotracheal tube tip projects above the level of the clavicles, 8.4 cm above the carina. Nasogastric tube past the GE junction, side port projecting at or just below the GE junction. Cardiomediastinal silhouette is normal. The lungs are clear without pleural effusions or focal consolidations. Bilateral lung base strandy densities. Trachea projects midline and there is no pneumothorax. Soft tissue planes and included osseous structures are non-suspicious.  IMPRESSION: Endotracheal tube tip projects 8.4 cm above the carina, recommend at least 3 cm of advancement. Nasogastric tube past GE junction.  Bilateral lung base atelectasis/scarring.   Electronically Signed   By: Elon Alas M.D.   On: 01/14/2015 06:47      Indwelling Urinary Catheter continued, requirement due to   Reason to continue Indwelling Urinary Catheter for strict Intake/Output monitoring for hemodynamic instability         Ventilator continued, requirement due to,  resp failure    Ventilator Sedation RASS 0 to -2   MAJOR EVENTS/TEST RESULTS: Ct head/neck>>negartive for trauma    INDWELLING DEVICES::  MICRO DATA: MRSA PCR  Urine  Blood Resp   ANTIMICROBIALS:     ASSESSMENT/PLAN  54 yo white male admitted to ICU for acute ETOH intoxication with acute encephalopathy leading to inability to protect airway Plan to wait for patient to wake up from intoxication then will attempt extubation   PULMONARY -Respiratory Failure -continue Full MV support -continue Bronchodilator Therapy -Wean Fio2 and PEEP as tolerated -will perform SAT/SBt when respiratory parameters are met    CARDIOVASCULAR -ICU monitoring -IVF's as needed  RENAL Follow UO, needs foley catheter for now  GASTROINTESTINAL -GI prophylaxis  HEMATOLOGIC -Follow h/h, follow plt count  INFECTIOUS -no infection at this time   ENDOCRINE Follow fsbs  NEUROLOGIC -encephalopathy from acute ETOH intoxication -sedation as needed   I have personally obtained a history, examined the patient, evaluated laboratory and independently reviewed  imaging results, formulated the assessment and plan and placed orders.  The Patient requires high complexity decision making for assessment and support, frequent evaluation and titration of therapies, application of advanced monitoring technologies and extensive interpretation of multiple databases. Critical Care Time devoted to patient care services described in this note is 45  minutes.   Overall, patient is critically ill, prognosis is guarded. Patient at high risk for cardiac arrest and death.    Corrin Parker, M.D.  [facility]  Medical Director [place] Director [facility]

## 2015-01-14 NOTE — ED Notes (Addendum)
Pt continues to attempt to get OOB, throwing legs over side rails and leaning over side rails, stating "I'm going to bed"; slurred speech, difficult to comprehend; pulling at c-collar; med admin as ordered

## 2015-01-14 NOTE — Clinical Social Work Note (Signed)
CSW received consult to see patient due to patient reportedly being homeless. Patient arrived to hospital intoxicated and was subsequently vented after doses of ativan were given. Prior to patient acting out, he had reportedly told staff he was not interested in ETOH rehab options. Patient currently remains on vent and CSW will follow up with patient when more appropriate. York Spaniel MSW,LCSW [phone]

## 2015-01-14 NOTE — ED Notes (Signed)
Pt becoming more restless, attempting to get OOB; increased slurring of words; pulling at c-collar; charge nurse to bedside; pt will not follow commands; MD notified and med admin

## 2015-01-14 NOTE — ED Notes (Signed)
Pt attempting to get OOB, very restless, throwing legs and upper body over side rails despite reorientation; st "I'm going to bed"; slurring of speech; alert to name only; MAEW, PERRL

## 2015-01-14 NOTE — ED Notes (Addendum)
Sat 90% on 100% NRB; Dr Dolores Frame at bedside with RT for intubation

## 2015-01-14 NOTE — H&P (Signed)
[facility] [place] at [facility]   PATIENT NAME: Aaron Roach    MR#:  [id number]  DATE OF BIRTH:  [birth year]  DATE OF ADMISSION:  01/14/2015  PRIMARY CARE PHYSICIAN: No primary care provider on file.   REQUESTING/REFERRING PHYSICIAN: Dolores Frame, M.D.  CHIEF COMPLAINT:   Chief Complaint  Patient presents with  . Fall    Pt. here via EMS from home for a fall laceration to top of head.    HISTORY OF PRESENT ILLNESS:  Aaron Roach  is a 54 y.o. male who presents with alcohol intoxication, and subsequent fall. Patient is currently intubated on this interviewer's examination is unable to contribute to the history of present illness. Collateral information is taken from ED physician and chart review. Patient arrived ED intoxicated, brought by friends who were concerned because he was stumbling and falling a lot, and on one fall hit his head and sustained an abrasion. On evaluation in the ED his alcohol level was found to be in the 400s. CT head and C-spine and x-ray of the lumbar spine all showed no acute trauma or fracture and no intracranial abnormality. However, patient was combative at the time of the exam and was given reasonable low doses of Ativan (0.5 mg IV 2) and Haldol 5 mg IV 1. After returning from the radiology Department patient became lethargic and then began to drop his O2 sats and began to have apneic episodes. Patient was intubated and hospitalists were called for admission for acute respiratory failure.  PAST MEDICAL HISTORY:  History reviewed. No pertinent past medical history.   Patient is unable to give any information as far as past medical history or medications hematemesis time, once family is able to be contacted this will need to be clarified.  PAST SURGICAL HISTORY:   Past Surgical History  Procedure Laterality Date  . Skin graft      SOCIAL HISTORY:   Social History  Substance Use Topics  . Smoking status: Current Every Day Smoker  -- 0.50 packs/day    Types: Cigarettes  . Smokeless tobacco: Not on file  . Alcohol Use: 3.6 oz/week    6 Cans of beer per week    FAMILY HISTORY:  No family history on file.   Patient unable to give any information at this time, once family can be contacted this will need to be clarified.  DRUG ALLERGIES:  No Known Allergies  MEDICATIONS AT HOME:   Prior to Admission medications   Not on File    REVIEW OF SYSTEMS:  Review of Systems  Unable to perform ROS: critical illness     VITAL SIGNS:   Filed Vitals:   01/14/15 0546 01/14/15 0558 01/14/15 0600 01/14/15 0619  BP:  134/88 133/81 153/105  Pulse: 82 92 85 91  Temp:      TempSrc:      Resp: 21 20 22 19   Height:      Weight:      SpO2: 94% 100% 100% 100%   Wt Readings from Last 3 Encounters:  01/14/15 63.504 kg (140 lb)    PHYSICAL EXAMINATION:  Physical Exam  Vitals reviewed. Constitutional: He is oriented to person, place, and time. He appears well-developed and well-nourished. No distress.  HENT:  Head: Normocephalic and atraumatic.  Mouth/Throat: Oropharynx is clear and moist.  Eyes: Conjunctivae and EOM are normal. Pupils are equal, round, and reactive to light. No scleral icterus.  Neck: Normal range of motion. Neck supple. No JVD present.  No thyromegaly present.  Cardiovascular: Normal rate, regular rhythm and intact distal pulses.  Exam reveals no gallop and no friction rub.   No murmur heard. Respiratory: Effort normal and breath sounds normal. No respiratory distress. He has no wheezes. He has no rales.  Intubated and on mechanical ventilation  GI: Soft. Bowel sounds are normal. He exhibits no distension. There is no tenderness.  Musculoskeletal: Normal range of motion. He exhibits no edema.  No arthritis, no gout  Lymphadenopathy:    He has no cervical adenopathy.  Neurological: He is alert and oriented to person, place, and time. No cranial nerve deficit.  No dysarthria, no aphasia  Skin:  Skin is warm and dry. No rash noted. No erythema.  Small abrasion near the vertex of his head  Psychiatric: He has a normal mood and affect. His behavior is normal. Judgment and thought content normal.    LABORATORY PANEL:   CBC  Recent Labs Lab 01/14/15 0230  WBC 8.4  HGB 14.1  HCT 40.3  PLT 131*   ------------------------------------------------------------------------------------------------------------------  Chemistries   Recent Labs Lab 01/14/15 0230  NA 134*  K 4.0  CL 103  CO2 23  GLUCOSE 105*  BUN 12  CREATININE 1.09  CALCIUM 8.5*  AST 77*  ALT 96*  ALKPHOS 135*  BILITOT 0.3   ------------------------------------------------------------------------------------------------------------------  Cardiac Enzymes No results for input(s): TROPONINI in the last 168 hours. ------------------------------------------------------------------------------------------------------------------  RADIOLOGY:  Dg Lumbar Spine Complete  01/14/2015   CLINICAL DATA:  Low back pain after multiple falls while drinking.  EXAM: LUMBAR SPINE - COMPLETE 4+ VIEW  COMPARISON:  None.  FINDINGS: Normal alignment of the lumbar spine. No vertebral compression deformities. Diffuse degenerative change with narrowed disc space heights and endplate hypertrophic changes. Degenerative disc disease at L4-5 and L5-S1. No focal bone lesion or bone destruction.  IMPRESSION: Degenerative changes in the lumbar spine. Normal alignment. No acute displaced fractures identified.   Electronically Signed   By: Burman Nieves M.D.   On: 01/14/2015 04:26   Ct Head Wo Contrast  01/14/2015   CLINICAL DATA:  Fall with head injury.  Intoxicated.  EXAM: CT HEAD WITHOUT CONTRAST  CT CERVICAL SPINE WITHOUT CONTRAST  TECHNIQUE: Multidetector CT imaging of the head and cervical spine was performed following the standard protocol without intravenous contrast. Multiplanar CT image reconstructions of the cervical spine were  also generated.  COMPARISON:  None.  FINDINGS: CT HEAD FINDINGS  Examination is technically limited due to motion artifact. Ventricles and sulci appear symmetrical. No mass effect or midline shift. No abnormal extra-axial fluid collections. Gray-white matter junctions are distinct. Basal cisterns are not effaced. No evidence of acute intracranial hemorrhage. No depressed skull fractures. Near complete opacification of the right maxillary antrum.  CT CERVICAL SPINE FINDINGS  Examination is limited due to motion artifact. Normal alignment of the cervical spine. Degenerative changes with narrowed interspaces and endplate hypertrophic changes predominantly at C3-4, C4-5, and C5-6 levels. No vertebral compression deformities. No prevertebral soft tissue swelling. C1-2 articulation appears intact. No focal bone lesion or bone destruction. Bone cortex and trabecular architecture appear intact. Vascular calcifications in the cervical carotid arteries.  IMPRESSION: No acute intracranial abnormalities. Opacification of right maxillary antrum.  Normal alignment of the cervical spine. Degenerative changes. No acute displaced fractures identified.   Electronically Signed   By: Burman Nieves M.D.   On: 01/14/2015 04:38   Ct Cervical Spine Wo Contrast  01/14/2015   CLINICAL DATA:  Fall with head injury.  Intoxicated.  EXAM: CT HEAD WITHOUT CONTRAST  CT CERVICAL SPINE WITHOUT CONTRAST  TECHNIQUE: Multidetector CT imaging of the head and cervical spine was performed following the standard protocol without intravenous contrast. Multiplanar CT image reconstructions of the cervical spine were also generated.  COMPARISON:  None.  FINDINGS: CT HEAD FINDINGS  Examination is technically limited due to motion artifact. Ventricles and sulci appear symmetrical. No mass effect or midline shift. No abnormal extra-axial fluid collections. Gray-white matter junctions are distinct. Basal cisterns are not effaced. No evidence of acute  intracranial hemorrhage. No depressed skull fractures. Near complete opacification of the right maxillary antrum.  CT CERVICAL SPINE FINDINGS  Examination is limited due to motion artifact. Normal alignment of the cervical spine. Degenerative changes with narrowed interspaces and endplate hypertrophic changes predominantly at C3-4, C4-5, and C5-6 levels. No vertebral compression deformities. No prevertebral soft tissue swelling. C1-2 articulation appears intact. No focal bone lesion or bone destruction. Bone cortex and trabecular architecture appear intact. Vascular calcifications in the cervical carotid arteries.  IMPRESSION: No acute intracranial abnormalities. Opacification of right maxillary antrum.  Normal alignment of the cervical spine. Degenerative changes. No acute displaced fractures identified.   Electronically Signed   By: Burman Nieves M.D.   On: 01/14/2015 04:38    EKG:  No orders found for this or any previous visit.  IMPRESSION AND PLAN:  Principal Problem:   Acute respiratory failure with hypoxemia - due to alcohol intoxication, intubated for the same. Admit to ICU with IV sedation and analgesia, ventilator protocols and wean as tolerated. Active Problems:   Alcohol intoxication - ICU alcohol withdrawal protocol in place, will use Precedex for his IV sedation to help avoid withdrawal, seizure precautions in place, will need med surg CIWA protocol as ordered once he is transferred out of ICU    Acute delirium - likely due to alcohol and acute respiratory failure. However, we'll continue to monitor his vital signs closely and if he develops any sign of infection or other precipitating cause workup will be initiated as appropriate.  All the records are reviewed and case discussed with ED provider. Management plans discussed with the patient and/or family.  DVT PROPHYLAXIS: SubQ lovenox  ADMISSION STATUS: Inpatient  CODE STATUS: Full code for now, the patient was unable to answer  this question and no family or friends are available at time of interview to clarify. Once patient is more alert once family can be contacted this question will need to be clarified   TOTAL CRITICAL CARE TIME TAKING CARE OF THIS PATIENT: 45 minutes.    Aaron Roach FIELDING 01/14/2015, 6:30 AM  [facility]  Office  [phone]  CC: Primary care physician; No primary care provider on file.

## 2015-01-14 NOTE — ED Provider Notes (Signed)
[facility] Emergency Department Provider Note  ____________________________________________  Time seen: Approximately 2:14 AM  I have reviewed the triage vital signs and the nursing notes.   HISTORY  Chief Complaint Fall  History limited by intoxication  HPI Aaron Roach is a 54 y.o. male who presents to the ED via EMS from the street with a chief complaint of fall with head injury. States he was drinking heavily with some friends and fell a couple of times. Unsure if patient experienced loss of consciousness.Patient arrives disheveled, alternating tearful and belligerent, having soiled his pants. Unable to obtain further history secondary to intoxication.   History reviewed. No pertinent past medical history.  There are no active problems to display for this patient.   Past Surgical History  Procedure Laterality Date  . Skin graft      No current outpatient prescriptions on file.  Allergies Review of patient's allergies indicates no known allergies.  No family history on file.  Social History Social History  Substance Use Topics  . Smoking status: Current Every Day Smoker -- 0.50 packs/day    Types: Cigarettes  . Smokeless tobacco: None  . Alcohol Use: 3.6 oz/week    6 Cans of beer per week    Review of Systems Constitutional: No fever/chills Eyes: No visual changes. ENT: No sore throat. Cardiovascular: Denies chest pain. Respiratory: Denies shortness of breath. Gastrointestinal: No abdominal pain.  No nausea, no vomiting.  No diarrhea.  No constipation. Genitourinary: Negative for dysuria. Musculoskeletal: Negative for back pain. Skin: Negative for rash. Neurological: Positive for head injury. Negative for headaches, focal weakness or numbness.  10-point ROS otherwise negative.  ____________________________________________   PHYSICAL EXAM:  VITAL SIGNS: ED Triage Vitals  Enc Vitals Group     BP 01/14/15 0137 122/81 mmHg      Pulse Rate 01/14/15 0137 72     Resp 01/14/15 0137 20     Temp --      Temp src --      SpO2 01/14/15 0137 100 %     Weight 01/14/15 0137 140 lb (63.504 kg)     Height 01/14/15 0137 5\' 6"  (1.676 m)     Head Cir --      Peak Flow --      Pain Score 01/14/15 0138 10     Pain Loc --      Pain Edu? --      Excl. in GC? --     Constitutional: Alert and oriented. Dishelved and in mild acute distress. Eyes: Conjunctivae are normal. PERRL. EOMI. Head: Macerated abrasion to vertex of head without active bleeding. No sutures required. Nose: No congestion/rhinnorhea. Mouth/Throat: Mucous membranes are moist.  Oropharynx non-erythematous. Neck: No stridor. No cervical spine tenderness to palpation. No step-offs or deformities. Cardiovascular: Normal rate, regular rhythm. Grossly normal heart sounds.  Good peripheral circulation. Respiratory: Normal respiratory effort.  No retractions. Lungs CTAB. Gastrointestinal: Soft and nontender. No distention. No abdominal bruits. No CVA tenderness. Musculoskeletal: No lower extremity tenderness nor edema.  No joint effusions. Neurologic: Slurred speech and language secondary to intoxication. No gross focal neurologic deficits are appreciated. Moves all extremities 4. Got up from bed and is standing to urinate with assistance.  Skin:  Skin is warm, dry and intact. No rash noted. Psychiatric: Mood and affect are normal. Speech and behavior are normal.  ____________________________________________   LABS (all labs ordered are listed, but only abnormal results are displayed)  Labs Reviewed  CBC WITH DIFFERENTIAL/PLATELET -  Abnormal; Notable for the following:    RBC 4.25 (*)    Platelets 131 (*)    All other components within normal limits  COMPREHENSIVE METABOLIC PANEL - Abnormal; Notable for the following:    Sodium 134 (*)    Glucose, Bld 105 (*)    Calcium 8.5 (*)    Total Protein 8.7 (*)    AST 77 (*)    ALT 96 (*)    Alkaline  Phosphatase 135 (*)    All other components within normal limits  ETHANOL - Abnormal; Notable for the following:    Alcohol, Ethyl (B) 397 (*)    All other components within normal limits  BLOOD GAS, ARTERIAL   ____________________________________________  EKG  None ____________________________________________  RADIOLOGY  CT head and cervical spine without contrast interpreted per Dr. Andria Meuse:  No acute intracranial abnormalities. Opacification of right maxillary antrum.  Normal alignment of the cervical spine. Degenerative changes. No acute displaced fractures identified.  Lumbar spine complete (viewed by me, interpreted per Dr. Andria Meuse:  Degenerative changes in the lumbar spine. Normal alignment. No acute displaced fractures identified. ___________________________________________   PROCEDURES  Procedure(s) performed:   INTUBATION Performed by: Irean Hong  Required items: required blood products, implants, devices, and special equipment available Patient identity confirmed: provided demographic data and hospital-assigned identification number Time out: Immediately prior to procedure a "time out" was called to verify the correct patient, procedure, equipment, support staff and site/side marked as required.  Indications: Respiratory failure   Intubation method: Glidescope Laryngoscopy   Preoxygenation: BVM  Sedatives: Etomidate Paralytic: Succinylcholine  Tube Size: 7.5 cuffed  Post-procedure assessment: chest rise and ETCO2 monitor Breath sounds: equal and absent over the epigastrium Tube secured with: ETT holder Chest x-ray interpreted by radiologist and me.  Chest x-ray findings: endotracheal tube not in appropriate position - respiratory therapist to advance 3 cm   Patient tolerated the procedure well with no immediate complications.    Critical Care performed:   CRITICAL CARE Performed by: Irean Hong   Total critical care time: 45  minutes  Critical care time was exclusive of separately billable procedures and treating other patients.  Critical care was necessary to treat or prevent imminent or life-threatening deterioration.  Critical care was time spent personally by me on the following activities: development of treatment plan with patient and/or surrogate as well as nursing, discussions with consultants, evaluation of patient's response to treatment, examination of patient, obtaining history from patient or surrogate, ordering and performing treatments and interventions, ordering and review of laboratory studies, ordering and review of radiographic studies, pulse oximetry and re-evaluation of patient's condition.  ____________________________________________   INITIAL IMPRESSION / ASSESSMENT AND PLAN / ED COURSE  Pertinent labs & imaging results that were available during my care of the patient were reviewed by me and considered in my medical decision making (see chart for details).  54 year old male s/p secondary to intoxication with head injury and lower back pain. Cervical collar applied for spinal immobilization. Will infuse IV fluids, update tetanus; obtain CT head and cervical spine to evaluate for acute traumatic injuries.  ----------------------------------------- 3:40 AM on 01/14/2015 -----------------------------------------  Patient remains belligerent despite 2 doses of IV Ativan. Unable to obtain CT head due to movement and belligerence. Will administer IV Haldol for sedation.  ----------------------------------------- 5:11 AM on 01/14/2015 -----------------------------------------  Patient sleeping soundly. Cervical collar discontinued. Will continue IV fluid resuscitation and observe patient.  ----------------------------------------- 6:03 AM on 01/14/2015 -----------------------------------------  Patient with periods of apnea lasting  5-10 seconds. Saturations on nasal cannula decrease to  45%. No gag reflex noted. Patient intubated for airway protection.  ----------------------------------------- 8:02 AM on 01/14/2015 -----------------------------------------  Post intubation chest x-ray:  Endotracheal tube tip projects 8.4 cm above the carina, recommend at least 3 cm of advancement. Nasogastric tube past GE junction.  Bilateral lung base atelectasis/scarring.  Will have respiratory therapist advance endotracheal tube 3 cm.   ____________________________________________   FINAL CLINICAL IMPRESSION(S) / ED DIAGNOSES  Final diagnoses:  Alcohol intoxication, with unspecified complication  Acute respiratory failure with hypoxia  Apnea  Hypotension, unspecified hypotension type      Irean Hong, MD 01/14/15 [phone]

## 2015-01-14 NOTE — Progress Notes (Signed)
Took over care of patient- at 11am- Patient sedated with propofol- Vitals stable- NSR on monitor.  So signs of pain-tolerating ventilator- Foley in place- urine output adequate.  Minimal bleeding from laceration from the head- Guaze in place.

## 2015-01-14 NOTE — ED Notes (Signed)
Pt assisted to stand at bedside to void; unsteady gait noted; assisted back to bed

## 2015-01-14 NOTE — ED Notes (Addendum)
c-collar removed per MD; pt with brief periods apnea noted and sats decreasing to 93%; MD notif and O2 applied; pt resting quietly now, no further restlessness noted

## 2015-01-14 NOTE — ED Notes (Signed)
Pt with bp 86/56 hr 63. Dr Dolores Frame notified. Propofol gtt decreased to and bolused approx   850cc from hanging ns bag per order.

## 2015-01-14 NOTE — ED Notes (Signed)
Pt. Presents to the ED intoxicated with a laceration to the top of head.  Pt. States he was drinking with some friends and fell a couple time.  Pt. Unsure if LOC.

## 2015-01-15 LAB — BASIC METABOLIC PANEL
Anion gap: 2 — ABNORMAL LOW (ref 5–15)
BUN: 11 mg/dL (ref 6–20)
CHLORIDE: 110 mmol/L (ref 101–111)
CO2: 28 mmol/L (ref 22–32)
CREATININE: 0.99 mg/dL (ref 0.61–1.24)
Calcium: 8.4 mg/dL — ABNORMAL LOW (ref 8.9–10.3)
GFR calc Af Amer: >60 mL/min (ref >60)
GLUCOSE: 90 mg/dL (ref 65–99)
POTASSIUM: 3.7 mmol/L (ref 3.5–5.1)
SODIUM: 140 mmol/L (ref 135–145)

## 2015-01-15 LAB — CBC
HEMATOCRIT: 35.1 % — AB (ref 40.0–52.0)
Hemoglobin: 11.7 g/dL — ABNORMAL LOW (ref 13.0–18.0)
MCH: 32.2 pg (ref 26.0–34.0)
MCHC: 33.4 g/dL (ref 32.0–36.0)
MCV: 96.3 fL (ref 80.0–100.0)
PLATELETS: 85 10*3/uL — AB (ref 150–440)
RBC: 3.64 MIL/uL — ABNORMAL LOW (ref 4.40–5.90)
RDW: 12.8 % (ref 11.5–14.5)
WBC: 6.3 10*3/uL (ref 3.8–10.6)

## 2015-01-15 LAB — MAGNESIUM: Magnesium: 1.6 mg/dL — ABNORMAL LOW (ref 1.7–2.4)

## 2015-01-15 LAB — PHOSPHORUS: Phosphorus: 4 mg/dL (ref 2.5–4.6)

## 2015-01-15 MED ORDER — DIAZEPAM 5 MG/ML IJ SOLN
5.0000 mg | Freq: Four times a day (QID) | INTRAMUSCULAR | Status: DC
  Administered 2015-01-15 – 2015-01-16 (×5): 5 mg via INTRAVENOUS
  Filled 2015-01-15 (×5): qty 2

## 2015-01-15 MED ORDER — LORAZEPAM 2 MG/ML IJ SOLN: 1.0000 mg | INTRAMUSCULAR | Status: DC | PRN

## 2015-01-15 MED ORDER — PRO-STAT SUGAR FREE PO LIQD: 30.0000 mL | Freq: Every day | ORAL | Status: DC

## 2015-01-15 MED ORDER — SODIUM CHLORIDE 0.9 % IV SOLN
INTRAVENOUS | Status: DC
  Administered 2015-01-15 – 2015-01-18 (×6): via INTRAVENOUS

## 2015-01-15 MED ORDER — THIAMINE HCL 100 MG/ML IJ SOLN
Freq: Once | INTRAVENOUS | Status: AC
  Administered 2015-01-15: 12:00:00 via INTRAVENOUS
  Filled 2015-01-15: qty 1000

## 2015-01-15 MED ORDER — FREE WATER: 200.0000 mL | Freq: Three times a day (TID) | Status: DC

## 2015-01-15 MED ORDER — OSMOLITE 1.2 CAL PO LIQD: 1000.0000 mL | ORAL | Status: DC

## 2015-01-15 MED ORDER — MAGNESIUM SULFATE 2 GM/50ML IV SOLN
2.0000 g | Freq: Once | INTRAVENOUS | Status: DC
  Administered 2015-01-15: 2 g via INTRAVENOUS
  Filled 2015-01-15: qty 50

## 2015-01-15 MED ORDER — MAGNESIUM SULFATE 4 GM/100ML IV SOLN
4.0000 g | Freq: Once | INTRAVENOUS | Status: AC
  Administered 2015-01-15: 4 g via INTRAVENOUS
  Filled 2015-01-15: qty 100

## 2015-01-15 MED ORDER — DIAZEPAM 2 MG PO TABS
5.0000 mg | ORAL_TABLET | Freq: Four times a day (QID) | ORAL | Status: DC
  Filled 2015-01-15: qty 3

## 2015-01-15 MED ORDER — SENNOSIDES-DOCUSATE SODIUM 8.6-50 MG PO TABS: 1.0000 | ORAL_TABLET | Freq: Two times a day (BID) | ORAL | Status: DC | PRN

## 2015-01-15 NOTE — Consult Note (Signed)
[facility] Consultation      Name: Aaron Roach MRN: [id number] DOB: 01-30-1961    ADMISSION DATE:  01/14/2015   CHIEF COMPLAINT:   Follow up resp failure   EVENTS OVERNIGHT  Remains intubated, patient agitated and delerious, does not follow commands, increased WOB on vent Plan for scheduled valium, consider precedex infusion, will start Tf's   EV E SIGNIFICANT EVENTS    Intubated 9/5    PAST MEDICAL HISTORY    :  History reviewed. No pertinent past medical history. Past Surgical History  Procedure Laterality Date  . Skin graft     Prior to Admission medications   Not on File   No Known Allergies   FAMILY HISTORY   History reviewed. No pertinent family history.    SOCIAL HISTORY    reports that he has been smoking Cigarettes.  He has been smoking about 0.50 packs per day. He does not have any smokeless tobacco history on file. He reports that he drinks about 3.6 oz of alcohol per week. He reports that he does not use illicit drugs.  Review of Systems  Unable to perform ROS: critical illness      VITAL SIGNS    Temp:  [98.3 F (36.8 C)-100 F (37.8 C)] 99.1 F (37.3 C) (09/06 0700) Pulse Rate:  [58-99] 58 (09/06 0700) Resp:  [13-21] 14 (09/06 0700) BP: (88-150)/(54-96) 119/76 mmHg (09/06 0700) SpO2:  [96 %-100 %] 100 % (09/06 0600) FiO2 (%):  [35 %] 35 % (09/06 0349) Weight:  [128 lb 8.5 oz (58.3 kg)] 128 lb 8.5 oz (58.3 kg) (09/06 0528) HEMODYNAMICS:   VENTILATOR SETTINGS: Vent Mode:  [-] PRVC FiO2 (%):  [35 %] 35 % Set Rate:  [14 bmp] 14 bmp Vt Set:  [500 mL] 500 mL PEEP:  [5 cmH20] 5 cmH20 Plateau Pressure:  [12 cmH20-14 cmH20] 12 cmH20 INTAKE / OUTPUT:  Intake/Output Summary (Last 24 hours) at 01/15/15 0830 Last data filed at 01/15/15 0700  Gross per 24 hour  Intake 1626.63 ml  Output   3175 ml  Net -1548.37 ml       PHYSICAL EXAM   Physical Exam  Constitutional: He appears distressed.  HENT:    Head: Normocephalic and atraumatic.  Eyes: Pupils are equal, round, and reactive to light. No scleral icterus.  Neck: Normal range of motion. Neck supple.  Cardiovascular: Normal rate and regular rhythm.   No murmur heard. Pulmonary/Chest: No respiratory distress. He has no wheezes. He has no rales.  resp distress  Abdominal: Soft. He exhibits no distension. There is no tenderness.  Musculoskeletal: He exhibits no edema.  Neurological: He displays normal reflexes. Coordination normal.  gcs<8T  Skin: Skin is warm. No rash noted. He is diaphoretic.       LABS   LABS:  CBC  Recent Labs Lab 01/14/15 0230 01/15/15 0337  WBC 8.4 6.3  HGB 14.1 11.7*  HCT 40.3 35.1*  PLT 131* 85*   Coag's No results for input(s): APTT, INR in the last 168 hours. BMET  Recent Labs Lab 01/14/15 0230 01/15/15 0337  NA 134* 140  K 4.0 3.7  CL 103 110  CO2 23 28  BUN 12 11  CREATININE 1.09 0.99  GLUCOSE 105* 90   Electrolytes  Recent Labs Lab 01/14/15 0230 01/15/15 0337  CALCIUM 8.5* 8.4*  MG 2.1  --   PHOS 4.3  --    Sepsis Markers No results for input(s): LATICACIDVEN, PROCALCITON, O2SATVEN in the  last 168 hours. ABG  Recent Labs Lab 01/14/15 0605 01/14/15 1225  PHART 7.33* 7.35  PCO2ART 42 46  PO2ART 173* 130*   Liver Enzymes  Recent Labs Lab 01/14/15 0230  AST 77*  ALT 96*  ALKPHOS 135*  BILITOT 0.3  ALBUMIN 4.3   Cardiac Enzymes No results for input(s): TROPONINI, PROBNP in the last 168 hours. Glucose No results for input(s): GLUCAP in the last 168 hours.   Recent Results (from the past 240 hour(s))  MRSA PCR Screening     Status: None   Collection Time: 01/14/15  9:00 AM  Result Value Ref Range Status   MRSA by PCR NEGATIVE NEGATIVE Final    Comment:        The GeneXpert MRSA Assay (FDA approved for NASAL specimens only), is one component of a comprehensive MRSA colonization surveillance program. It is not intended to diagnose  MRSA infection nor to guide or monitor treatment for MRSA infections.      Current facility-administered medications:  .  0.9 %  sodium chloride infusion, , Intravenous, Continuous, Theodoro Grist, MD, Last Rate: 100 mL/hr at 01/15/15 0128 .  acetaminophen (TYLENOL) tablet 650 mg, 650 mg, Oral, Q6H PRN **OR** acetaminophen (TYLENOL) suppository 650 mg, 650 mg, Rectal, Q6H PRN, Lance Coon, MD .  antiseptic oral rinse solution (CORINZ), 7 mL, Mouth Rinse, 10 times per day, Lance Coon, MD, 7 mL at 01/15/15 0600 .  chlorhexidine gluconate (PERIDEX) 0.12 % solution 15 mL, 15 mL, Mouth Rinse, BID, Lance Coon, MD, 15 mL at 01/15/15 0732 .  diazepam (VALIUM) tablet 5 mg, 5 mg, Oral, Q6H, Flora Lipps, MD .  enoxaparin (LOVENOX) injection 40 mg, 40 mg, Subcutaneous, Q24H, Lance Coon, MD, 40 mg at 01/14/15 1233 .  fentaNYL (SUBLIMAZE) injection 100 mcg, 100 mcg, Intravenous, Q1H PRN, Flora Lipps, MD, 100 mcg at 01/15/15 0806 .  LORazepam (ATIVAN) injection 2 mg, 2 mg, Intravenous, Q30 min PRN, Flora Lipps, MD, 2 mg at 01/15/15 0754 .  ondansetron (ZOFRAN) tablet 4 mg, 4 mg, Oral, Q6H PRN **OR** ondansetron (ZOFRAN) injection 4 mg, 4 mg, Intravenous, Q6H PRN, Lance Coon, MD .  pantoprazole (PROTONIX) injection 40 mg, 40 mg, Intravenous, Daily, Flora Lipps, MD .  propofol (DIPRIVAN) 1000 MG/100ML infusion, 5-80 mcg/kg/min, Intravenous, Titrated, Flora Lipps, MD, Stopped at 01/15/15 0700 .  sodium chloride 0.9 % injection 3 mL, 3 mL, Intravenous, Q12H, Lance Coon, MD, 3 mL at 01/14/15 2250  IMAGING    Dg Abd 1 View  01/14/2015   CLINICAL DATA:  54 year old male status post enteric tube placement. Initial encounter.  EXAM: ABDOMEN - 1 VIEW  COMPARISON:  Lumbar radiographs 0356 hours today.  FINDINGS: AP supine view of the abdomen at 1154 hours. Enteric tube placed, side hole at the level of the gastric fundus. Stable, negative bowel gas pattern. Negative visualized lung bases. Stable visualized  osseous structures.  IMPRESSION: Enteric tube side hole at the level of the proximal stomach.   Electronically Signed   By: Genevie Ann M.D.   On: 01/14/2015 12:24      Indwelling Urinary Catheter continued, requirement due to   Reason to continue Indwelling Urinary Catheter for strict Intake/Output monitoring for hemodynamic instability         Ventilator continued, requirement due to, resp failure    Ventilator Sedation RASS 0 to -2   MAJOR EVENTS/TEST RESULTS: Ct head/neck>>negartive for trauma    INDWELLING DEVICES::  MICRO DATA: MRSA PCR >>negative Urine  Blood Resp  ANTIMICROBIALS:     ASSESSMENT/PLAN  54 yo white male admitted to ICU for acute ETOH intoxication with acute encephalopathy leading to inability to protect airway Plan to wait for patient to wake up from intoxication then will attempt extubation   PULMONARY -Respiratory Failure -continue Full MV support -continue Bronchodilator Therapy -Wean Fio2 and PEEP as tolerated -will perform SAT/SBt when respiratory parameters are met    CARDIOVASCULAR -ICU monitoring -IVF's as needed  RENAL Follow UO, needs foley catheter for now  GASTROINTESTINAL -GI prophylaxis  HEMATOLOGIC -Follow h/h, follow plt count  INFECTIOUS -no infection at this time   ENDOCRINE Follow fsbs  NEUROLOGIC -remains encephalopathy from acute ETOH intoxication -sedation as needed-will shchedule valium, consider precedex ?DT's    I have personally obtained a history, examined the patient, evaluated laboratory and independently reviewed  imaging results, formulated the assessment and plan and placed orders.  The Patient requires high complexity decision making for assessment and support, frequent evaluation and titration of therapies, application of advanced monitoring technologies and extensive interpretation of multiple databases. Critical Care Time devoted to patient care services described in this note is 45   minutes.   Overall, patient is critically ill, prognosis is guarded. Patient at high risk for cardiac arrest and death.    Corrin Parker, M.D.  [facility]  Medical Director [place] Director [facility] Cardio-Pulmonary Department

## 2015-01-15 NOTE — Progress Notes (Signed)
Per Dr. Zorita Pang telephone order- Administer both magnesium 2 gram and the magnesium 4gram to patient.

## 2015-01-15 NOTE — Progress Notes (Addendum)
Patient self-extubated- Patient vitals stable-NSR on monitor- oxygen satis 96% on room air.  Dr. Belia Heman made aware- no new orders. Updated Dr. Zorita Pang of patient- Orders to place patient on CIWA precautions.

## 2015-01-15 NOTE — Progress Notes (Signed)
Patient sleeping all afternoon since self-extubation- vitals stable- NSR on monitor- Sating 95% on room air.  Foley in place-urine output adequate.

## 2015-01-15 NOTE — Progress Notes (Signed)
[facility] - [place] at [facility]   PATIENT NAME: Aaron Roach    MR#:  [id number]  DATE OF BIRTH:  03-23-1961  SUBJECTIVE:  CHIEF COMPLAINT:   Chief Complaint  Patient presents with  . Fall    Pt. here via EMS from home for a fall laceration to top of head.   patient is 54 year old Caucasian male with history of alcohol abuse who was brought by EMS for fall and laceration of the top of his head. CT scan of the head and neck were unremarkable. As patient was agitated. He received a few small doses of Ativan as well as Haldol and became lethargic, hypoxic , apneic and was intubated. Remains intubated and off sedation. However, not able to follow commands, not opening eyes and is agitated, reaching to the tube site. Sedation was restarted. Remains on mechanical ventilation agitated intermittently and does not follow commands whenever sedation is off. Dr. Belia Heman initiated patient on Valium around-the-clock with hopes that he'll be able to wean him off the vent and extubate tomorrow. No review of systems as patient is intubated  Review of Systems  Unable to perform ROS: intubated    VITAL SIGNS: Blood pressure 131/77, pulse 75, temperature 99.5 F (37.5 C), temperature source Axillary, resp. rate 15, height  (1.676 m), weight 58.3 kg (128 lb 8.5 oz), SpO2 100 %.  PHYSICAL EXAMINATION:   GENERAL:  54 y.o.-year-old patient lying in the bed in mild to moderate distress, not opening eyes, does not follow commands, however, able to go lift up his arms reach up to the tube was attempted to take it out.  EYES: Pupils equal, round, reactive to light and accommodation. No scleral icterus. Extraocular muscles intact.  HEENT: Head reveals laceration on the vertex, no bleeding, some dry blood was noted, normocephalic. Oropharynx and nasopharynx clear. ET tube is in place NECK:  Supple, no jugular venous distention. No thyroid enlargement, no tenderness.  LUNGS: Slightly  diminished breath sounds bilaterally, no wheezing, bilateral rales,rhonchi  in all lung fields but no crepitations. ET tube secretions are thin and clear.  Intermittently using accessory muscles of respiration, especially when agitated.  CARDIOVASCULAR: S1, S2 normal. No murmurs, rubs, or gallops.  ABDOMEN: Soft, nontender, nondistended. Bowel sounds present. No organomegaly or mass.  EXTREMITIES: No pedal edema, cyanosis, or clubbing.  NEUROLOGIC: Cranial nerves II through XII are intact grossly. Muscle strength 5/5 in upper extremities, not able to evaluated lower extremities due to poor mobility. Sensation not able to evaluate. Gait not checked.  PSYCHIATRIC: The patient is somnolent , does not open eyes. Does not verbalize agitated intermittently, does not follow commands , not able to assess orientation .  SKIN: No obvious rash, lesion, or ulcer.   ORDERS/RESULTS REVIEWED:   CBC  Recent Labs Lab 01/14/15 0230 01/15/15 0337  WBC 8.4 6.3  HGB 14.1 11.7*  HCT 40.3 35.1*  PLT 131* 85*  MCV 95.0 96.3  MCH 33.2 32.2  MCHC 35.0 33.4  RDW 12.5 12.8  LYMPHSABS 3.3  --   MONOABS 0.8  --   EOSABS 0.4  --   BASOSABS 0.1  --    ------------------------------------------------------------------------------------------------------------------  Chemistries   Recent Labs Lab 01/14/15 0230 01/15/15 0337  NA 134* 140  K 4.0 3.7  CL 103 110  CO2 23 28  GLUCOSE 105* 90  BUN 12 11  CREATININE 1.09 0.99  CALCIUM 8.5* 8.4*  MG 2.1 1.6*  AST 77*  --   ALT  96*  --   ALKPHOS 135*  --   BILITOT 0.3  --    ------------------------------------------------------------------------------------------------------------------ estimated creatinine clearance is 70.3 mL/min (by C-G formula based on Cr of 0.99). ------------------------------------------------------------------------------------------------------------------ No results for input(s): TSH, T4TOTAL, T3FREE, THYROIDAB in the last 72  hours.  Invalid input(s): FREET3  Cardiac Enzymes No results for input(s): CKMB, TROPONINI, MYOGLOBIN in the last 168 hours.  Invalid input(s): CK ------------------------------------------------------------------------------------------------------------------ Invalid input(s): POCBNP ---------------------------------------------------------------------------------------------------------------  RADIOLOGY: Dg Lumbar Spine Complete  01/14/2015   CLINICAL DATA:  Low back pain after multiple falls while drinking.  EXAM: LUMBAR SPINE - COMPLETE 4+ VIEW  COMPARISON:  None.  FINDINGS: Normal alignment of the lumbar spine. No vertebral compression deformities. Diffuse degenerative change with narrowed disc space heights and endplate hypertrophic changes. Degenerative disc disease at L4-5 and L5-S1. No focal bone lesion or bone destruction.  IMPRESSION: Degenerative changes in the lumbar spine. Normal alignment. No acute displaced fractures identified.   Electronically Signed   By: Burman Nieves M.D.   On: 01/14/2015 04:26   Dg Abd 1 View  01/14/2015   CLINICAL DATA:  54 year old male status post enteric tube placement. Initial encounter.  EXAM: ABDOMEN - 1 VIEW  COMPARISON:  Lumbar radiographs 0356 hours today.  FINDINGS: AP supine view of the abdomen at 1154 hours. Enteric tube placed, side hole at the level of the gastric fundus. Stable, negative bowel gas pattern. Negative visualized lung bases. Stable visualized osseous structures.  IMPRESSION: Enteric tube side hole at the level of the proximal stomach.   Electronically Signed   By: Odessa Fleming M.D.   On: 01/14/2015 12:24   Ct Head Wo Contrast  01/14/2015   CLINICAL DATA:  Fall with head injury.  Intoxicated.  EXAM: CT HEAD WITHOUT CONTRAST  CT CERVICAL SPINE WITHOUT CONTRAST  TECHNIQUE: Multidetector CT imaging of the head and cervical spine was performed following the standard protocol without intravenous contrast. Multiplanar CT image  reconstructions of the cervical spine were also generated.  COMPARISON:  None.  FINDINGS: CT HEAD FINDINGS  Examination is technically limited due to motion artifact. Ventricles and sulci appear symmetrical. No mass effect or midline shift. No abnormal extra-axial fluid collections. Gray-white matter junctions are distinct. Basal cisterns are not effaced. No evidence of acute intracranial hemorrhage. No depressed skull fractures. Near complete opacification of the right maxillary antrum.  CT CERVICAL SPINE FINDINGS  Examination is limited due to motion artifact. Normal alignment of the cervical spine. Degenerative changes with narrowed interspaces and endplate hypertrophic changes predominantly at C3-4, C4-5, and C5-6 levels. No vertebral compression deformities. No prevertebral soft tissue swelling. C1-2 articulation appears intact. No focal bone lesion or bone destruction. Bone cortex and trabecular architecture appear intact. Vascular calcifications in the cervical carotid arteries.  IMPRESSION: No acute intracranial abnormalities. Opacification of right maxillary antrum.  Normal alignment of the cervical spine. Degenerative changes. No acute displaced fractures identified.   Electronically Signed   By: Burman Nieves M.D.   On: 01/14/2015 04:38   Ct Cervical Spine Wo Contrast  01/14/2015   CLINICAL DATA:  Fall with head injury.  Intoxicated.  EXAM: CT HEAD WITHOUT CONTRAST  CT CERVICAL SPINE WITHOUT CONTRAST  TECHNIQUE: Multidetector CT imaging of the head and cervical spine was performed following the standard protocol without intravenous contrast. Multiplanar CT image reconstructions of the cervical spine were also generated.  COMPARISON:  None.  FINDINGS: CT HEAD FINDINGS  Examination is technically limited due to motion artifact. Ventricles and sulci appear symmetrical.  No mass effect or midline shift. No abnormal extra-axial fluid collections. Gray-white matter junctions are distinct. Basal cisterns are  not effaced. No evidence of acute intracranial hemorrhage. No depressed skull fractures. Near complete opacification of the right maxillary antrum.  CT CERVICAL SPINE FINDINGS  Examination is limited due to motion artifact. Normal alignment of the cervical spine. Degenerative changes with narrowed interspaces and endplate hypertrophic changes predominantly at C3-4, C4-5, and C5-6 levels. No vertebral compression deformities. No prevertebral soft tissue swelling. C1-2 articulation appears intact. No focal bone lesion or bone destruction. Bone cortex and trabecular architecture appear intact. Vascular calcifications in the cervical carotid arteries.  IMPRESSION: No acute intracranial abnormalities. Opacification of right maxillary antrum.  Normal alignment of the cervical spine. Degenerative changes. No acute displaced fractures identified.   Electronically Signed   By: Burman Nieves M.D.   On: 01/14/2015 04:38   Dg Chest Port 1 View  01/14/2015   CLINICAL DATA:  Post intubation.  EXAM: PORTABLE CHEST - 1 VIEW  COMPARISON:  None.  FINDINGS: Endotracheal tube tip projects above the level of the clavicles, 8.4 cm above the carina. Nasogastric tube past the GE junction, side port projecting at or just below the GE junction. Cardiomediastinal silhouette is normal. The lungs are clear without pleural effusions or focal consolidations. Bilateral lung base strandy densities. Trachea projects midline and there is no pneumothorax. Soft tissue planes and included osseous structures are non-suspicious.  IMPRESSION: Endotracheal tube tip projects 8.4 cm above the carina, recommend at least 3 cm of advancement. Nasogastric tube past GE junction.  Bilateral lung base atelectasis/scarring.   Electronically Signed   By: Awilda Metro M.D.   On: 01/14/2015 06:47    EKG: No orders found for this or any previous visit.  ASSESSMENT AND PLAN:  Principal Problem:   Acute respiratory failure with hypoxemia Active  Problems:   Alcohol intoxication   Acute delirium 1. Acute delirium  due to alcohol intoxication, now watch for DTs, supportive therapy, ammonia level is normal , now on Valium around-the-clock following mental status tomorrow morning, possible extubation tomorrow if patient is able to follow commands 2. Acute respiratory failure with hypoxia, likely due to alcohol intoxication in combination with sedating medications, of sedation with propofol for now, now on Valium around-the-clock per pulmonologist's recommendations.  Following patient's mental status closely and extubate him tomorrow as possible. Chest x-ray showed no congestive heart failure or pneumonia, but atelectasis and scarring 3. Hyponatremia, resolved, likely dehydration related , continue low rate IV fluids. Will be on nutrition via G-tube while on ventilator, following sodium level in the morning 4. Elevated transaminases, likely alcoholic hepatitis versus infectious hepatitis, follow liver function test in the morning. Patient's lipase level was normal, as well as ammonia level 5. Hypomagnesemia supplementing IV .    Management plans discussed with the patient, family and they are in agreement.   DRUG ALLERGIES: No Known Allergies  CODE STATUS:     Code Status Orders        Start     Ordered   01/14/15 0901  Full code   Continuous     01/14/15 0900      TOTAL CRITICAL CARE TIME TAKING CARE OF THIS PATIENT: 35 minutes.   The patient's management was discussed with pulmonologist/ intensivist, Dr. Belia Heman.   Katharina Caper M.D on 01/15/2015 at 12:51 PM  Between 7am to 6pm - Pager - [phone]  After 6pm go to [email or web address] - password [email or web address] [facility]  Office  [phone]  CC: Primary care physician; No primary care provider on file.

## 2015-01-15 NOTE — Progress Notes (Signed)
Paged Dr. Anne Hahn and spoke with him regarding possibility of pt. Being placed on CIWA protocol.  Per MD, tonight leave pt on meds as ordered.  If pt. Becomes tachycardic, begins withdrawal ,dt, symptoms then will reassess.

## 2015-01-15 NOTE — Progress Notes (Signed)
Fentanyl given.

## 2015-01-15 NOTE — Progress Notes (Signed)
PHARMACY - CRITICAL CARE PROGRESS NOTE  Pharmacy Consult for Constipation Prevention    No Known Allergies  Patient Measurements: Height:  (167.6 cm) Weight: 128 lb 8.5 oz (58.3 kg) IBW/kg (Calculated) : 63.8   Vital Signs: Temp: 99.1 F (37.3 C) (09/06 0700) Temp Source: Oral (09/06 0700) BP: 131/77 mmHg (09/06 0900) Pulse Rate: 75 (09/06 0900) Intake/Output from previous day: 09/05 0701 - 09/06 0700 In: 1626.6 [I.V.:1626.6] Out: 3175 [Urine:3175] Intake/Output from this shift:   Vent settings for last 24 hours: Vent Mode:  [-] PRVC FiO2 (%):  [35 %] 35 % Set Rate:  [14 bmp] 14 bmp Vt Set:  [500 mL] 500 mL PEEP:  [5 cmH20] 5 cmH20 Plateau Pressure:  [12 cmH20-14 cmH20] 12 cmH20  Labs:  Recent Labs  01/14/15 0230 01/15/15 0337  WBC 8.4 6.3  HGB 14.1 11.7*  HCT 40.3 35.1*  PLT 131* 85*  CREATININE 1.09 0.99  MG 2.1 1.6*  PHOS 4.3 4.0  ALBUMIN 4.3  --   PROT 8.7*  --   AST 77*  --   ALT 96*  --   ALKPHOS 135*  --   BILITOT 0.3  --    Estimated Creatinine Clearance: 70.3 mL/min (by C-G formula based on Cr of 0.99).  No results for input(s): GLUCAP in the last 72 hours.  Microbiology: Recent Results (from the past 720 hour(s))  MRSA PCR Screening     Status: None   Collection Time: 01/14/15  9:00 AM  Result Value Ref Range Status   MRSA by PCR NEGATIVE NEGATIVE Final    Comment:        The GeneXpert MRSA Assay (FDA approved for NASAL specimens only), is one component of a comprehensive MRSA colonization surveillance program. It is not intended to diagnose MRSA infection nor to guide or monitor treatment for MRSA infections.     Medications:  Scheduled:  . antiseptic oral rinse  7 mL Mouth Rinse 10 times per day  . chlorhexidine gluconate  15 mL Mouth Rinse BID  . diazepam  5 mg Intravenous QID  . enoxaparin (LOVENOX) injection  40 mg Subcutaneous Q24H  . pantoprazole (PROTONIX) IV  40 mg Intravenous Daily  . banana bag IV 1000 mL    Intravenous Once  . sodium chloride  3 mL Intravenous Q12H   Infusions:  . sodium chloride 100 mL/hr at 01/15/15 0916  . propofol (DIPRIVAN) infusion Stopped (01/15/15 0700)   PRN: acetaminophen **OR** acetaminophen, fentaNYL (SUBLIMAZE) injection, LORazepam, ondansetron **OR** ondansetron (ZOFRAN) IV, senna-docusate  Assessment: 54 y/o M admitted with acute ETOH intoxication with encephalopathy ventilated, sedated.   Plan:  Will begin senna/docusate 1 tab bid prn and f/u am rounds.   Luisa Hart D 01/15/2015,10:58 AM

## 2015-01-15 NOTE — Progress Notes (Signed)
Pharmacy Consult for Electrolyte Management  No Known Allergies  Patient Measurements: Height:  (167.6 cm) Weight: 128 lb 8.5 oz (58.3 kg) IBW/kg (Calculated) : 63.8  Vital Signs: Temp: 99.5 F (37.5 C) (09/06 1144) Temp Source: Axillary (09/06 1144) BP: 131/77 mmHg (09/06 0900) Pulse Rate: 75 (09/06 0900) Intake/Output from previous day: 09/05 0701 - 09/06 0700 In: 1626.6 [I.V.:1626.6] Out: 3175 [Urine:3175] Intake/Output from this shift: Total I/O In: 100 [I.V.:100] Out: 325 [Urine:325]  Labs:  Recent Labs  01/14/15 0230 01/15/15 0337  WBC 8.4 6.3  HGB 14.1 11.7*  HCT 40.3 35.1*  PLT 131* 85*     Recent Labs  01/14/15 0230 01/15/15 0337  NA 134* 140  K 4.0 3.7  CL 103 110  CO2 23 28  GLUCOSE 105* 90  BUN 12 11  CREATININE 1.09 0.99  CALCIUM 8.5* 8.4*  MG 2.1 1.6*  PHOS 4.3 4.0  PROT 8.7*  --   ALBUMIN 4.3  --   AST 77*  --   ALT 96*  --   ALKPHOS 135*  --   BILITOT 0.3  --   TRIG 179*  --    Estimated Creatinine Clearance: 70.3 mL/min (by C-G formula based on Cr of 0.99).   No results for input(s): GLUCAP in the last 72 hours.  Medical History: History reviewed. No pertinent past medical history.  Medications:  Scheduled:  . antiseptic oral rinse  7 mL Mouth Rinse 10 times per day  . chlorhexidine gluconate  15 mL Mouth Rinse BID  . diazepam  5 mg Intravenous QID  . enoxaparin (LOVENOX) injection  40 mg Subcutaneous Q24H  . magnesium sulfate 1 - 4 g bolus IVPB  2 g Intravenous Once  . pantoprazole (PROTONIX) IV  40 mg Intravenous Daily  . sodium chloride  3 mL Intravenous Q12H   Infusions:  . sodium chloride 100 mL/hr at 01/15/15 0916  . propofol (DIPRIVAN) infusion Stopped (01/15/15 0700)   PRN: acetaminophen **OR** acetaminophen, fentaNYL (SUBLIMAZE) injection, LORazepam, ondansetron **OR** ondansetron (ZOFRAN) IV, senna-docusate  Assessment: Pharmacy consulted to assist in managing electrolytes in this 54 y/o M admitted with  acute delirium from alcohol intoxication.   Plan:  Electrolytes are wnl except for magnesium which is low so will replace with magnesium sulfate 2 g iv once and f/u am labs.   Luisa Hart D 01/15/2015,11:58 AM

## 2015-01-15 NOTE — Progress Notes (Signed)
  Pt remains on vent. Vent settings as charted.sedated with propofol.SR on CM.lung sound diminished.foley in place. Urine out put good. Draining clear yellow urine. Temp99.2 now. Inj ativanx2 and fentanyl given as ordered. Resting in bed without any distress. Continue to observe closely.

## 2015-01-15 NOTE — Progress Notes (Signed)
Initial Nutrition Assessment    INTERVENTION:   EN: recommend starting Osmolite 1.2 with goal rate of 55 ml/hr with Prostat daily providing 89 g of protein, 1684 kcals, 1069 mL of free water. Will need to reassess goal rate if diprivan restarted. Noted mag and phos pending for tomorrow AM labs. Continue to assess   NUTRITION DIAGNOSIS:   Inadequate oral intake related to acute illness as evidenced by NPO status.  GOAL:   Patient will meet greater than or equal to 90% of their needs  MONITOR:    (Energy Intake, Digestive systen, Anthropometrics, Electrolyte/Renal Profile)  REASON FOR ASSESSMENT:   Ventilator, Consult Enteral/tube feeding initiation and management  ASSESSMENT:    Pt admitted with acute respiratory failure with EtOH intoxication, acute delerium; currently sedated on vent  History reviewed. No pertinent past medical history.  Diet Order:  Diet NPO time specified  Last BM:  no BM since admission   Electrolyte and Renal Profile:  Recent Labs Lab 01/14/15 0230 01/15/15 0337  BUN 12 11  CREATININE 1.09 0.99  NA 134* 140  K 4.0 3.7  MG 2.1 1.6*  PHOS 4.3 4.0   Glucose Profile: No results for input(s): GLUCAP in the last 72 hours. Protein Profile:   Recent Labs Lab 01/14/15 0230  ALBUMIN 4.3   Nutritional Anemia Profile:  CBC Latest Ref Rng 01/15/2015 01/14/2015  WBC 3.8 - 10.6 K/uL 6.3 8.4  Hemoglobin 13.0 - 18.0 g/dL 11.7(L) 14.1  Hematocrit 40.0 - 52.0 % 35.1(L) 40.3  Platelets 150 - 440 K/uL 85(L) 131(L)    Meds: diprivan 501 kcals in past 24 hours but off at present with no plans to restart, valium, NS at 100 ml/hr, fentanyl, ativan  Nutrition Focused Physical Exam:  Unable to complete Nutrition-Focused physical exam at this time.   Height:   Ht Readings from Last 1 Encounters:  01/14/15  (1.676 m)    Weight:   Wt Readings from Last 1 Encounters:  01/15/15 128 lb 8.5 oz (58.3 kg)    BMI:  Body mass index is 20.75  kg/(m^2).  Estimated Nutritional Needs:   Kcal:  1723 kcals (BEE 1361, Ve: 10.2, Tmax: 37.8) using wt of 58.3 kg  Protein:  87-116 g (1.5-2.0 g/kg)   Fluid:  1740-2030 mL (30-35 ml/kg)     HIGH Care Level  Romelle Starcher MS, RD, LDN [phone] Pager

## 2015-01-15 NOTE — Progress Notes (Signed)
Pain medicine administered

## 2015-01-16 LAB — HEPATIC FUNCTION PANEL
ALK PHOS: 67 U/L (ref 38–126)
ALT: 78 U/L — ABNORMAL HIGH (ref 17–63)
AST: 84 U/L — AB (ref 15–41)
Albumin: 3.2 g/dL — ABNORMAL LOW (ref 3.5–5.0)
BILIRUBIN DIRECT: 0.2 mg/dL (ref 0.1–0.5)
BILIRUBIN TOTAL: 0.9 mg/dL (ref 0.3–1.2)
Indirect Bilirubin: 0.7 mg/dL (ref 0.3–0.9)
Total Protein: 7.3 g/dL (ref 6.5–8.1)

## 2015-01-16 LAB — BASIC METABOLIC PANEL
ANION GAP: 6 (ref 5–15)
BUN: 9 mg/dL (ref 6–20)
CALCIUM: 8.1 mg/dL — AB (ref 8.9–10.3)
CO2: 28 mmol/L (ref 22–32)
CREATININE: 0.87 mg/dL (ref 0.61–1.24)
Chloride: 103 mmol/L (ref 101–111)
GFR calc Af Amer: >60 mL/min (ref >60)
GLUCOSE: 92 mg/dL (ref 65–99)
Potassium: 3.4 mmol/L — ABNORMAL LOW (ref 3.5–5.1)
Sodium: 137 mmol/L (ref 135–145)

## 2015-01-16 LAB — GLUCOSE, CAPILLARY: Glucose-Capillary: 86 mg/dL (ref 65–99)

## 2015-01-16 LAB — MAGNESIUM: Magnesium: 1.8 mg/dL (ref 1.7–2.4)

## 2015-01-16 LAB — PHOSPHORUS: Phosphorus: 3 mg/dL (ref 2.5–4.6)

## 2015-01-16 MED ORDER — POTASSIUM CHLORIDE CRYS ER 20 MEQ PO TBCR: 20.0000 meq | EXTENDED_RELEASE_TABLET | Freq: Two times a day (BID) | ORAL | Status: DC

## 2015-01-16 MED ORDER — DIAZEPAM 5 MG/ML IJ SOLN
2.5000 mg | Freq: Four times a day (QID) | INTRAMUSCULAR | Status: DC
  Administered 2015-01-16 – 2015-01-17 (×5): 2.5 mg via INTRAVENOUS
  Filled 2015-01-16 (×6): qty 2

## 2015-01-16 MED ORDER — POTASSIUM CHLORIDE 10 MEQ/100ML IV SOLN
10.0000 meq | INTRAVENOUS | Status: AC
  Administered 2015-01-16 (×3): 10 meq via INTRAVENOUS
  Filled 2015-01-16 (×3): qty 100

## 2015-01-16 MED ORDER — MENTHOL 3 MG MT LOZG
1.0000 | LOZENGE | OROMUCOSAL | Status: DC | PRN
  Administered 2015-01-16: 3 mg via ORAL
  Filled 2015-01-16: qty 9

## 2015-01-16 MED ORDER — CALCIUM CARBONATE ANTACID 500 MG PO CHEW: 1.0000 | CHEWABLE_TABLET | Freq: Two times a day (BID) | ORAL | Status: DC

## 2015-01-16 MED ORDER — SODIUM CHLORIDE 0.9 % IV SOLN
2.0000 g | Freq: Once | INTRAVENOUS | Status: AC
  Administered 2015-01-16: 2 g via INTRAVENOUS
  Filled 2015-01-16: qty 20

## 2015-01-16 NOTE — Clinical Social Work Note (Signed)
Clinical Social Work Assessment  Patient Details  Name: Aaron Roach MRN: [id number] Date of Birth: [birth year]  Date of referral:  01/16/15               Reason for consult:  Substance Use/ETOH Abuse, Housing Concerns/Homelessness                Permission sought to share information with:    Permission granted to share information::     Name::        Agency::     Relationship::     Contact Information:     Housing/Transportation Living arrangements for the past 2 months:   (tent) Source of Information:  Patient Patient Interpreter Needed:  None Criminal Activity/Legal Involvement Pertinent to Current Situation/Hospitalization:  No - Comment as needed Significant Relationships:  None Lives with:  Self Do you feel safe going back to the place where you live?  Yes Need for family participation in patient care:  No (Coment)  Care giving concerns:  Patient lives alone in a tent.   Social Worker assessment / plan:  Patient had self extubated himself in the ICU yesterday. Patient was laying in his ICU bed this morning and was lethargic and had a flat affect. Patient was able to respond appropriately to CSW but spoke in short sentences. Patient informed CSW that he lives in a tent. CSW spoke with patient regarding the homeless shelter as a possible option but patient stated that he did not want to pursue that option and rather return to his tent. When CSW asked what patient did for food, he stated that he receives food stamps each month, approximately $150 per month. When asked if he wanted assistance with his ETOH abuse, patient stated that he was not going to drink again and that he knows it is not helping his situation. CSW offered resources to give to him and he paused and stated "I guess it couldn't hurt and that would be okay." CSW to bring patient information.   Employment status:  Unemployed Health and safety inspector:  Self Pay (Medicaid Pending) PT Recommendations:  Not assessed at  this time Information / Referral to community resources:     Patient/Family's Response to care:  Patient appreciated CSW visit.  Patient/Family's Understanding of and Emotional Response to Diagnosis, Current Treatment, and Prognosis:  Patient was tired and not interested in discussing much but did answer questions appropriately and willingly.  Emotional Assessment Appearance:  Appears older than stated age Attitude/Demeanor/Rapport:   (lethargic but pleasant and cooperative) Affect (typically observed):  Flat, Quiet Orientation:  Oriented to Self, Oriented to Place, Oriented to  Time, Oriented to Situation Alcohol / Substance use:  Alcohol Use Psych involvement (Current and /or in the community):  No (Comment)  Discharge Needs  Concerns to be addressed:  Substance Abuse Concerns, Basic Needs Readmission within the last 30 days:  No Current discharge risk:  Homeless, Substance Abuse Barriers to Discharge:  Active Substance Use (lives in tent)   [place], [place] 01/16/2015, 10:27 AM

## 2015-01-16 NOTE — Progress Notes (Signed)
Pt transferred from CCU. A & O. Room air. IVF going. NSR. NPO. Skin checked by Alanson Puls, RN. Pt has not reported any pain . Pt has no further concerns at this time.

## 2015-01-16 NOTE — Progress Notes (Signed)
Pt requesting something for a sore throat.  MD, Dr. Clint Guy notified.   MD to place order.  Will continue to monitor. Louis Meckel

## 2015-01-16 NOTE — Progress Notes (Addendum)
Pharmacy Consult for Electrolyte Management  No Known Allergies  Patient Measurements: Height:  (167.6 cm) Weight: 127 lb 13.9 oz (58 kg) IBW/kg (Calculated) : 63.8  Vital Signs: Temp: 98.3 F (36.8 C) (09/07 0100) Temp Source: Axillary (09/07 0100) BP: 133/74 mmHg (09/07 0500) Pulse Rate: 57 (09/07 0500) Intake/Output from previous day: 09/06 0701 - 09/07 0700 In: 1095 [I.V.:945; IV Piggyback:150] Out: 1550 [Urine:1550] Intake/Output from this shift: Total I/O In: -  Out: 850 [Urine:850]  Labs:  Recent Labs  01/14/15 0230 01/15/15 0337  WBC 8.4 6.3  HGB 14.1 11.7*  HCT 40.3 35.1*  PLT 131* 85*     Recent Labs  01/14/15 0230 01/15/15 0337 01/16/15 0349  NA 134* 140 137  K 4.0 3.7 3.4*  CL 103 110 103  CO2 GLUCOSE 105* 90 92  BUN CREATININE 1.09 0.99 0.87  CALCIUM 8.5* 8.4* 8.1*  MG 2.1 1.6* 1.8  PHOS 4.3 4.0 3.0  PROT 8.7*  --   --   ALBUMIN 4.3  --   --   AST 77*  --   --   ALT 96*  --   --   ALKPHOS 135*  --   --   BILITOT 0.3  --   --   TRIG 179*  --   --    Estimated Creatinine Clearance: 79.6 mL/min (by C-G formula based on Cr of 0.87).   No results for input(s): GLUCAP in the last 72 hours.  Medical History: History reviewed. No pertinent past medical history.  Medications:  Scheduled:  . calcium carbonate  1 tablet Oral BID  . diazepam  5 mg Intravenous QID  . enoxaparin (LOVENOX) injection  40 mg Subcutaneous Q24H  . pantoprazole (PROTONIX) IV  40 mg Intravenous Daily  . potassium chloride  20 mEq Oral BID  . sodium chloride  3 mL Intravenous Q12H   Infusions:  . sodium chloride 100 mL/hr at 01/16/15 0524  . propofol (DIPRIVAN) infusion Stopped (01/15/15 0700)   PRN: acetaminophen **OR** acetaminophen, fentaNYL (SUBLIMAZE) injection, LORazepam, ondansetron **OR** ondansetron (ZOFRAN) IV, senna-docusate  Assessment: Pharmacy consulted to assist in managing electrolytes in this 54 y/o M admitted with  acute delirium from alcohol intoxication.   Plan:  Electrolytes are wnl except for magnesium which is low so will replace with magnesium sulfate 2 g iv once and f/u am labs.   9/7 All WNL except K+ 3.4 and Ca 8.1. KCl 20 mEq x2 doses and calcium carbonate x 2 doses ordered. F/u labs in AM.  Aaron Roach S 01/16/2015,5:32 AM

## 2015-01-16 NOTE — Progress Notes (Signed)
[facility] - [place] [facility]   PATIENT NAME: Aaron Roach    MR#:  [id number]  DATE OF BIRTH:  07/22/1960  SUBJECTIVE:  CHIEF COMPLAINT:   Chief Complaint  Patient presents with  . Fall    Pt. here via EMS from home for a fall laceration to top of head.   patient is 54 year old Caucasian male with history of alcohol abuse who was brought by EMS for fall and laceration of the top of his head. CT scan of the head and neck were unremarkable. As patient was agitated. He received a few small doses of Ativan as well as Haldol and became lethargic, hypoxic , apneic and was intubated.  Extubated on 01/15/15- On round the clock valium IV. falied swallow trial by nurse last night- also have agitation spells every now and then.  Review of Systems  Unable to perform ROS: other  pt is sedated with valium.  VITAL SIGNS: Blood pressure 133/85, pulse 60, temperature 98.3 F (36.8 C), temperature source Axillary, resp. rate 18, height  (1.676 m), weight 58 kg (127 lb 13.9 oz), SpO2 96 %.  PHYSICAL EXAMINATION:   GENERAL:  54 y.o.-year-old patient lying in the bed in no distress, response to opening eye and moving limbs aimlessly to painful stimuli- then again go back to sleep. EYES: Pupils equal, round, reactive to light. No scleral icterus.  HEENT: Head reveals laceration on the vertex, no bleeding, some dry blood was noted, normocephalic. Oropharynx clear.  NECK:  Supple, no jugular venous distention. No thyroid enlargement, no tenderness.  LUNGS: Slightly diminished breath sounds bilaterally, no wheezing, bilateral rales,rhonchi  in all lung fields but no crepitations.  CARDIOVASCULAR: S1, S2 normal. No murmurs, rubs, or gallops.  ABDOMEN: Soft, nontender, nondistended. Bowel sounds present. No organomegaly or mass.  EXTREMITIES: No pedal edema, cyanosis, or clubbing.  NEUROLOGIC:  He is sedated due to valium, opens eye and moves limbs slowly- to stimuli.  Not  following commands, not able to check cranial nerves. PSYCHIATRIC: The patient is somnolent , does not open eyes. Does not verbalize agitated intermittently, does not follow commands , not able to assess orientation .  SKIN: No obvious rash, lesion, or ulcer.   ORDERS/RESULTS REVIEWED:   CBC  Recent Labs Lab 01/14/15 0230 01/15/15 0337  WBC 8.4 6.3  HGB 14.1 11.7*  HCT 40.3 35.1*  PLT 131* 85*  MCV 95.0 96.3  MCH 33.2 32.2  MCHC 35.0 33.4  RDW 12.5 12.8  LYMPHSABS 3.3  --   MONOABS 0.8  --   EOSABS 0.4  --   BASOSABS 0.1  --    ------------------------------------------------------------------------------------------------------------------  Chemistries   Recent Labs Lab 01/14/15 0230 01/15/15 0337 01/16/15 0349  NA 134* 140 137  K 4.0 3.7 3.4*  CL 103 110 103  CO2 GLUCOSE 105* 90 92  BUN CREATININE 1.09 0.99 0.87  CALCIUM 8.5* 8.4* 8.1*  MG 2.1 1.6* 1.8  AST 77*  --   --   ALT 96*  --   --   ALKPHOS 135*  --   --   BILITOT 0.3  --   --    ------------------------------------------------------------------------------------------------------------------ estimated creatinine clearance is 79.6 mL/min (by C-G formula based on Cr of 0.87). ------------------------------------------------------------------------------------------------------------------ No results for input(s): TSH, T4TOTAL, T3FREE, THYROIDAB in the last 72 hours.  Invalid input(s): FREET3  Cardiac Enzymes No results for input(s): CKMB, TROPONINI, MYOGLOBIN in the last 168 hours.  Invalid input(s): CK ------------------------------------------------------------------------------------------------------------------ Invalid input(s): POCBNP ---------------------------------------------------------------------------------------------------------------  RADIOLOGY: Dg Abd 1 View  01/14/2015   CLINICAL DATA:  54 year old male status post enteric tube placement. Initial encounter.   EXAM: ABDOMEN - 1 VIEW  COMPARISON:  Lumbar radiographs 0356 hours today.  FINDINGS: AP supine view of the abdomen at 1154 hours. Enteric tube placed, side hole at the level of the gastric fundus. Stable, negative bowel gas pattern. Negative visualized lung bases. Stable visualized osseous structures.  IMPRESSION: Enteric tube side hole at the level of the proximal stomach.   Electronically Signed   By: Odessa Fleming M.D.   On: 01/14/2015 12:24    EKG: No orders found for this or any previous visit.  ASSESSMENT AND PLAN:  1. Acute delirium  due to alcohol intoxication, now likely for DTs, supportive therapy, ammonia level is normal , now on Valium around-the-clock following mental status  2. Acute respiratory failure with hypoxia, likely due to alcohol intoxication in combination with sedating medications, now on Valium around-the-clock per pulmonologist's recommendations.  Extubated on 01/15/15 Chest x-ray showed no congestive heart failure or pneumonia, but atelectasis and scarring 3. Hyponatremia, resolved, likely dehydration related , continue low rate IV fluids.  swallow eval.  4. Elevated transaminases, likely alcoholic hepatitis- recheck, 5. Hypomagnesemia supplementing IV- improved.  Will transfer to floor today. Do SLP eval.  Management plans discussed with the patient, family and they are in agreement.   DRUG ALLERGIES: No Known Allergies  CODE STATUS:     Code Status Orders        Start     Ordered   01/14/15 0901  Full code   Continuous     01/14/15 0900      TOTAL TIME TAKING CARE OF THIS PATIENT: 35 minutes.    Altamese Dilling M.D on 01/16/2015 at 9:26 AM  Between 7am to 6pm - Pager - [phone] After 6pm go to [email or web address] - password [email or web address]  [facility]  Office  [phone]  CC: Primary care physician; No primary care provider on file.

## 2015-01-16 NOTE — Progress Notes (Signed)
Patient complained of sore throat and requested aspirin. PRN acetaminophen given. He swallowed pills without any problems.

## 2015-01-16 NOTE — Progress Notes (Signed)
Nutrition Follow-up   INTERVENTION:   Medical Food Supplement Therapy: recommend addition of nutritional supplement once diet advanced   NUTRITION DIAGNOSIS:   Inadequate oral intake related to acute illness as evidenced by NPO status. Continues but being addressed as SLP eval ordered for diet progression post extubation  GOAL:   Patient will meet greater than or equal to 90% of their needs   MONITOR:    (Energy Intake, Digestive systen, Anthropometrics, Electrolyte/Renal Profile)  ASSESSMENT:    Pt s/p self-extubation yesterday, failed bedside swallow by RN last night, agitated at times, on scheduled valium  Diet Order:  Diet NPO time specified Except for: Ice Chips  Electrolyte and Renal Profile:  Recent Labs Lab 01/14/15 0230 01/15/15 0337 01/16/15 0349  BUN CREATININE 1.09 0.99 0.87  NA 134* 140 137  K 4.0 3.7 3.4*  MG 2.1 1.6* 1.8  PHOS 4.3 4.0 3.0   Glucose Profile:   Recent Labs  01/14/15 0855  GLUCAP 86   Meds: valium, NS at 100 ml/hr  Last BM:  no BM since admission  Height:   Ht Readings from Last 1 Encounters:  01/14/15  (1.676 m)    Weight:   Wt Readings from Last 1 Encounters:  01/16/15 127 lb 13.9 oz (58 kg)    BMI:  Body mass index is 20.65 kg/(m^2).  Estimated Nutritional Needs:   Kcal:  1740-2030 kcals   Protein:  64-75 g (1.1-1.3 g/kg)   Fluid:  1740-2030 mL (30-35 ml/kg)   HIGH Care Level  Romelle Starcher MS, RD, LDN [phone] Pager

## 2015-01-16 NOTE — Progress Notes (Signed)
Pharmacy Consult for Electrolyte Management  No Known Allergies  Patient Measurements: Height:  (167.6 cm) Weight: 127 lb 13.9 oz (58 kg) IBW/kg (Calculated) : 63.8  Vital Signs: Temp: 98.3 F (36.8 C) (09/07 0500) Temp Source: Axillary (09/07 0500) BP: 133/85 mmHg (09/07 0600) Pulse Rate: 60 (09/07 0700) Intake/Output from previous day: 09/06 0701 - 09/07 0700 In: 2950 [I.V.:2800; IV Piggyback:150] Out: 1550 [Urine:1550] Intake/Output from this shift:    Labs:  Recent Labs  01/14/15 0230 01/15/15 0337  WBC 8.4 6.3  HGB 14.1 11.7*  HCT 40.3 35.1*  PLT 131* 85*     Recent Labs  01/14/15 0230 01/15/15 0337 01/16/15 0349  NA 134* 140 137  K 4.0 3.7 3.4*  CL 103 110 103  CO2 GLUCOSE 105* 90 92  BUN CREATININE 1.09 0.99 0.87  CALCIUM 8.5* 8.4* 8.1*  MG 2.1 1.6* 1.8  PHOS 4.3 4.0 3.0  PROT 8.7*  --   --   ALBUMIN 4.3  --   --   AST 77*  --   --   ALT 96*  --   --   ALKPHOS 135*  --   --   BILITOT 0.3  --   --   TRIG 179*  --   --    Estimated Creatinine Clearance: 79.6 mL/min (by C-G formula based on Cr of 0.87).   No results for input(s): GLUCAP in the last 72 hours.  Medical History: History reviewed. No pertinent past medical history.  Medications:  Scheduled:  . calcium gluconate  2 g Intravenous Once  . diazepam  5 mg Intravenous QID  . enoxaparin (LOVENOX) injection  40 mg Subcutaneous Q24H  . pantoprazole (PROTONIX) IV  40 mg Intravenous Daily  . potassium chloride  10 mEq Intravenous Q1 Hr x 3  . sodium chloride  3 mL Intravenous Q12H   Infusions:  . sodium chloride 100 mL/hr at 01/16/15 0524  . propofol (DIPRIVAN) infusion Stopped (01/15/15 0700)   PRN: acetaminophen **OR** acetaminophen, fentaNYL (SUBLIMAZE) injection, LORazepam, ondansetron **OR** ondansetron (ZOFRAN) IV, senna-docusate  Assessment: Pharmacy consulted to assist in managing electrolytes in this 54 y/o M admitted with acute delirium from  alcohol intoxication.   Plan:  Electrolytes are wnl except for magnesium which is low so will replace with magnesium sulfate 2 g iv once and f/u am labs.   9/7 All WNL except K+ 3.4 and Ca 8.1. KCl 20 mEq x2 doses and calcium carbonate x 2 doses ordered. F/u labs in AM.  Patient is NPO and recently extubated so will change potassium and calcium orders to potassium chloride 10 meq x 3 and calcium gluconate 2 grams x 1 and f/u AM labs.   Aaron Roach 01/16/2015,7:38 AM

## 2015-01-16 NOTE — Progress Notes (Signed)
PHARMACY - CRITICAL CARE PROGRESS NOTE  Pharmacy Consult for Constipation Prevention    No Known Allergies  Patient Measurements: Height:  (167.6 cm) Weight: 127 lb 13.9 oz (58 kg) IBW/kg (Calculated) : 63.8   Vital Signs: Temp: 98.3 F (36.8 C) (09/07 0739) Temp Source: Axillary (09/07 0739) BP: 126/87 mmHg (09/07 1100) Pulse Rate: 54 (09/07 1100) Intake/Output from previous day: 09/06 0701 - 09/07 0700 In: 2950 [I.V.:2800; IV Piggyback:150] Out: 1550 [Urine:1550] Intake/Output from this shift:   Vent settings for last 24 hours:    Labs:  Recent Labs  01/14/15 0230 01/15/15 0337 01/16/15 0349  WBC 8.4 6.3  --   HGB 14.1 11.7*  --   HCT 40.3 35.1*  --   PLT 131* 85*  --   CREATININE 1.09 0.99 0.87  MG 2.1 1.6* 1.8  PHOS 4.3 4.0 3.0  ALBUMIN 4.3  --  3.2*  PROT 8.7*  --  7.3  AST 77*  --  84*  ALT 96*  --  78*  ALKPHOS 135*  --  67  BILITOT 0.3  --  0.9  BILIDIR  --   --  0.2  IBILI  --   --  0.7   Estimated Creatinine Clearance: 79.6 mL/min (by C-G formula based on Cr of 0.87).  No results for input(s): GLUCAP in the last 72 hours.  Microbiology: Recent Results (from the past 720 hour(s))  MRSA PCR Screening     Status: None   Collection Time: 01/14/15  9:00 AM  Result Value Ref Range Status   MRSA by PCR NEGATIVE NEGATIVE Final    Comment:        The GeneXpert MRSA Assay (FDA approved for NASAL specimens only), is one component of a comprehensive MRSA colonization surveillance program. It is not intended to diagnose MRSA infection nor to guide or monitor treatment for MRSA infections.     Medications:  Scheduled:  . diazepam  2.5 mg Intravenous QID  . enoxaparin (LOVENOX) injection  40 mg Subcutaneous Q24H  . pantoprazole (PROTONIX) IV  40 mg Intravenous Daily  . sodium chloride  3 mL Intravenous Q12H   Infusions:  . sodium chloride 100 mL/hr at 01/16/15 0524  . propofol (DIPRIVAN) infusion Stopped (01/15/15 0700)   PRN:  acetaminophen **OR** acetaminophen, fentaNYL (SUBLIMAZE) injection, LORazepam, ondansetron **OR** ondansetron (ZOFRAN) IV, senna-docusate  Assessment: 54 y/o M admitted with acute ETOH intoxication with encephalopathy ventilated, sedated.   Plan:  Patient with no bowel movement to date currently on senna/docusate 1 bid prn. Patient is transferring to floor and is off sedation at this point. Will sign off on consult and f/u with hospitalist rounds.      Luisa Hart D 01/16/2015,11:16 AM

## 2015-01-17 LAB — BASIC METABOLIC PANEL
Anion gap: 5 (ref 5–15)
BUN: 9 mg/dL (ref 6–20)
CALCIUM: 8.5 mg/dL — AB (ref 8.9–10.3)
CO2: 27 mmol/L (ref 22–32)
Chloride: 106 mmol/L (ref 101–111)
Creatinine, Ser: 0.79 mg/dL (ref 0.61–1.24)
GFR calc Af Amer: >60 mL/min (ref >60)
GLUCOSE: 96 mg/dL (ref 65–99)
Potassium: 3.6 mmol/L (ref 3.5–5.1)
Sodium: 138 mmol/L (ref 135–145)

## 2015-01-17 LAB — MAGNESIUM: Magnesium: 1.5 mg/dL — ABNORMAL LOW (ref 1.7–2.4)

## 2015-01-17 LAB — PHOSPHORUS: Phosphorus: 3.5 mg/dL (ref 2.5–4.6)

## 2015-01-17 LAB — TRIGLYCERIDES: Triglycerides: 74 mg/dL (ref <150)

## 2015-01-17 MED ORDER — MAGNESIUM SULFATE 2 GM/50ML IV SOLN
2.0000 g | Freq: Once | INTRAVENOUS | Status: AC
  Administered 2015-01-18: 2 g via INTRAVENOUS
  Filled 2015-01-17: qty 50

## 2015-01-17 NOTE — Care Management (Signed)
Spoke with Adela Lank with [facility].  Provided some resource information and transportation information that will be given to patient at discharge.  Patient says that he has a friend that will take him to the [facility]

## 2015-01-17 NOTE — Evaluation (Signed)
Clinical/Bedside Swallow Evaluation Patient Details  Name: Aaron Roach MRN: [id number] Date of Birth: [birth year]  Today's Date: 01/17/2015 Time: SLP Start Time (ACUTE ONLY): 0900 SLP Stop Time (ACUTE ONLY): 1000 SLP Time Calculation (min) (ACUTE ONLY): 60 min  Past Medical History: History reviewed. No pertinent past medical history. Past Surgical History:  Past Surgical History  Procedure Laterality Date  . Skin graft     HPI:  Aaron Roach is a 54 y.o. male who presents with alcohol intoxication, and subsequent fall. Patient is currently intubated on this interviewer's examination is unable to contribute to the history of present illness. Collateral information is taken from ED physician and chart review. Patient arrived ED intoxicated, brought by friends who were concerned because he was stumbling and falling a lot, and on one fall hit his head and sustained an abrasion. On evaluation in the ED his alcohol level was found to be in the 400s. CT head and C-spine and x-ray of the lumbar spine all showed no acute trauma or fracture and no intracranial abnormality. However, patient was combative at the time of the exam and was given reasonable low doses of Ativan (0.5 mg IV 2) and Haldol 5 mg IV 1. After returning from the radiology Department patient became lethargic and then began to drop his O2 sats and began to have apneic episodes. Patient was intubated and hospitalists were called for admission for acute respiratory failure. Pt. is currently awake and alert.  He extubated himself 9/6 after one day of intubation.  He reported not knowing why he was in the hospital and circumstances for admission. Pt reported medical information indicating a "disease" affecting left hand and both feet (appeared stiff and spastic).  Pt. also reported a previous back injury. Pt. appeared cognizant of past medical and social history and was oriented to time and space.  He was conversant throughout the visit and  appropriate in conversation.   Assessment / Plan / Recommendation Clinical Impression  Pt. appears to present with no overt s/s of aspiration and a functional oral/pharyngeal phase swallow.  During the visit, no overt s/s of aspiration observed.  Pt. consumed thin liquids (2-3 ounces of coffee) solids (cereal) and mixed consistencies.  Did not formally assess oral motor; during bolus management oral motor appears WFL. Pt. self fed with set up and positioning.    Aspiration Risk   (reduced)    Diet Recommendation Age appropriate regular solids;Thin   Medication Administration: Whole meds with liquid (as tolerated) Compensations: Small sips/bites    Other  Recommendations Recommended Consults:  (social work) Oral Care Recommendations: Oral care BID;Patient independent with oral care   Follow Up Recommendations       Frequency and Duration        Pertinent Vitals/Pain None reported    SLP Swallow Goals  n/a    Swallow Study Prior Functional Status   Pt. lived independently     General Date of Onset: 01/14/15 Other Pertinent Information: Aaron Roach is a 54 y.o. male who presents with alcohol intoxication, and subsequent fall. Patient is currently intubated on this interviewer's examination is unable to contribute to the history of present illness. Collateral information is taken from ED physician and chart review. Patient arrived ED intoxicated, brought by friends who were concerned because he was stumbling and falling a lot, and on one fall hit his head and sustained an abrasion. On evaluation in the ED his alcohol level was found to be in the 400s. CT head and  C-spine and x-ray of the lumbar spine all showed no acute trauma or fracture and no intracranial abnormality. However, patient was combative at the time of the exam and was given reasonable low doses of Ativan (0.5 mg IV 2) and Haldol 5 mg IV 1. After returning from the radiology Department patient became lethargic and  then began to drop his O2 sats and began to have apneic episodes. Patient was intubated and hospitalists were called for admission for acute respiratory failure. Pt. is currently awake and alert.  He extubated himself 9/6 after one day of intubation.  He reported not knowing why he was in the hospital and circumstances for admission. Pt reported medical information indicating a "disease" affecting left hand and both feet (appeared stiff and spastic).  Pt. also reported a previous back injury. Pt. appeared cognizant of past medical and social history and was oriented to time and space.  He was conversant throughout the visit and appropriate in conversation. Type of Study: Bedside swallow evaluation Temperature Spikes Noted: No Respiratory Status: Room air History of Recent Intubation: Yes Length of Intubations (days): 1 days Date extubated: 01/15/15 (Pt. extubated himself) Behavior/Cognition: Alert;Cooperative;Pleasant mood (unsure of cause of admission (hx/circumstance of admission)) Oral Cavity - Dentition: Adequate natural dentition/normal for age Self-Feeding Abilities: Able to feed self;Needs set up Patient Positioning: Upright in bed Baseline Vocal Quality: Normal (appeared wnl) Volitional Cough:  (NT) Volitional Swallow: Able to elicit (observed)    Oral/Motor/Sensory Function Overall Oral Motor/Sensory Function: Appears within functional limits for tasks assessed Labial ROM: Within Functional Limits Labial Symmetry: Within Functional Limits Labial Strength: Within Functional Limits (with boluses) Labial Sensation:  (NT) Lingual ROM: Within Functional Limits Lingual Symmetry:  (NT) Lingual Strength: Within Functional Limits (with bolus management) Lingual Sensation:  (NT) Facial Symmetry: Within Functional Limits   Ice Chips Ice chips: Not tested   Thin Liquid Thin Liquid: Within functional limits Presentation: Cup Other Comments: Pt. drank 2-3 ouces of thin liquid (coffee) and  consumed mixed consistency (cereal with milk) with no overt s/s of aspiration    Nectar Thick Nectar Thick Liquid: Not tested   Honey Thick Honey Thick Liquid: Not tested   Puree Puree: Not tested   Solid   GO    Solid: Within functional limits (serveral bites of cereal without overt s/s of aspiration) Presentation: Self Fed;Spoon      Jerilynn Som, MS, CCC-SLP  Aaron Roach 01/17/2015,10:03 AM

## 2015-01-17 NOTE — Progress Notes (Signed)
Nutrition Follow-up    INTERVENTION:   Meals and Snacks: Cater to patient preferences   NUTRITION DIAGNOSIS:   Inadequate oral intake related to acute illness as evidenced by NPO status. Improving as diet advanced post extubation, taking po  GOAL:   Patient will meet greater than or equal to 90% of their needs  MONITOR:    (Energy Intake, Digestive systen, Anthropometrics, Electrolyte/Renal Profile)  REASON FOR ASSESSMENT:   Ventilator, Consult Enteral/tube feeding initiation and management  ASSESSMENT:    Pt alert on visit today, reports his tummy feels like it is "rolling," feels like he is on a ship at sea  Diet Order:  Diet Heart Room service appropriate?: Yes; Fluid consistency:: Thin  Energy Intake: pt reports appetite is good, reports he ate lunch today but no intake recorded  Electrolyte and Renal Profile:  Recent Labs Lab 01/15/15 0337 01/16/15 0349 01/17/15 0409  BUN CREATININE 0.99 0.87 0.79  NA 140 137 138  K 3.7 3.4* 3.6  MG 1.6* 1.8 1.5*  PHOS 4.0 3.0 3.5   Glucose Profile: No results for input(s): GLUCAP in the last 72 hours. Nutritional Anemia Profile:  CBC Latest Ref Rng 01/15/2015 01/14/2015  WBC 3.8 - 10.6 K/uL 6.3 8.4  Hemoglobin 13.0 - 18.0 g/dL 11.7(L) 14.1  Hematocrit 40.0 - 52.0 % 35.1(L) 40.3  Platelets 150 - 440 K/uL 85(L) 131(L)    Meds: NS at 100 ml/hr  Skin:  Reviewed, no issues  Last BM:  9/8  Height:   Ht Readings from Last 1 Encounters:  01/14/15  (1.676 m)    Weight:   Wt Readings from Last 1 Encounters:  01/17/15 128 lb 14.4 oz (58.469 kg)   BMI:  Body mass index is 20.82 kg/(m^2).  Estimated Nutritional Needs:   Kcal:  1740-2030 kcals   Protein:  64-75 g (1.1-1.3 g/kg)   Fluid:  1740-2030 mL (30-35 ml/kg)   MODERATE Care Level  Romelle Starcher MS, RD, LDN [phone] Pager

## 2015-01-17 NOTE — Progress Notes (Signed)
[facility] - [place] at [facility]   PATIENT NAME: Aaron Roach    MR#:  [id number]  DATE OF BIRTH:  April 13, 1961  SUBJECTIVE:  CHIEF COMPLAINT:   Chief Complaint  Patient presents with  . Fall    Pt. here via EMS from home for a fall laceration to top of head.   patient is 54 year old Caucasian male with history of alcohol abuse who was brought by EMS for fall and laceration of the top of his head. CT scan of the head and neck were unremarkable. As patient was agitated. He received a few small doses of Ativan as well as Haldol and became lethargic, hypoxic , apneic and was intubated.  Extubated on 01/15/15- On round the clock valium IV.  More alert now. No agitation.  Review of Systems  Constitutional: Negative for fever, chills and weight loss.  HENT: Negative for hearing loss.   Eyes: Negative for blurred vision.  Respiratory: Negative for cough, shortness of breath and wheezing.   Cardiovascular: Negative for chest pain and palpitations.  Gastrointestinal: Negative for nausea, vomiting and abdominal pain.  Genitourinary: Negative for dysuria, urgency and frequency.  Skin: Negative for rash.  Neurological: Positive for weakness. Negative for dizziness, tremors, focal weakness and headaches.    VITAL SIGNS: Blood pressure 138/89, pulse 65, temperature 98.1 F (36.7 C), temperature source Oral, resp. rate 19, height 5\' 6"  (1.676 m), weight 58.469 kg (128 lb 14.4 oz), SpO2 97 %.  PHYSICAL EXAMINATION:   GENERAL:  54 y.o.-year-old patient lying in the bed in no distress, alert. EYES: Pupils equal, round, reactive to light. No scleral icterus.  HEENT: Head reveals laceration on the vertex, no bleeding,  normocephalic. Oropharynx clear.  NECK:  Supple, no jugular venous distention. No thyroid enlargement, no tenderness.  LUNGS: Slightly diminished breath sounds bilaterally, no wheezing, bilateral rales,rhonchi  in all lung fields but no crepitations.   CARDIOVASCULAR: S1, S2 normal. No murmurs, rubs, or gallops.  ABDOMEN: Soft, nontender, nondistended. Bowel sounds present. No organomegaly or mass.  EXTREMITIES: No pedal edema, cyanosis, or clubbing.  NEUROLOGIC:  Cranial nerves intact,  Follows commands, mild tremers, sensation intact, power 5/5 all 4 limbs. PSYCHIATRIC: alert and oriented.  SKIN: No obvious rash, lesion, or ulcer.   ORDERS/RESULTS REVIEWED:   CBC  Recent Labs Lab 01/14/15 0230 01/15/15 0337  WBC 8.4 6.3  HGB 14.1 11.7*  HCT 40.3 35.1*  PLT 131* 85*  MCV 95.0 96.3  MCH 33.2 32.2  MCHC 35.0 33.4  RDW 12.5 12.8  LYMPHSABS 3.3  --   MONOABS 0.8  --   EOSABS 0.4  --   BASOSABS 0.1  --    ------------------------------------------------------------------------------------------------------------------  Chemistries   Recent Labs Lab 01/14/15 0230 01/15/15 0337 01/16/15 0349 01/17/15 0409  NA 134* 140 137 138  K 4.0 3.7 3.4* 3.6  CL 103 110 103 106  CO2 23 28 28 27   GLUCOSE 105* 90 92 96  BUN 12 11 9 9   CREATININE 1.09 0.99 0.87 0.79  CALCIUM 8.5* 8.4* 8.1* 8.5*  MG 2.1 1.6* 1.8 1.5*  AST 77*  --  84*  --   ALT 96*  --  78*  --   ALKPHOS 135*  --  67  --   BILITOT 0.3  --  0.9  --    ------------------------------------------------------------------------------------------------------------------ estimated creatinine clearance is 87.3 mL/min (by C-G formula based on Cr of 0.79). ------------------------------------------------------------------------------------------------------------------ No results for input(s): TSH, T4TOTAL, T3FREE, THYROIDAB in the last  72 hours.  Invalid input(s): FREET3  Cardiac Enzymes No results for input(s): CKMB, TROPONINI, MYOGLOBIN in the last 168 hours.  Invalid input(s): CK ------------------------------------------------------------------------------------------------------------------ Invalid input(s):  POCBNP ---------------------------------------------------------------------------------------------------------------  RADIOLOGY: No results found.  EKG: No orders found for this or any previous visit.  ASSESSMENT AND PLAN:  1. Acute delirium  due to alcohol intoxication, now likely for DTs, supportive therapy, ammonia level is normal , now on Valium around-the-clock following mental status   improved significantly- if stable- likely d/c tomorrow. 2. Acute respiratory failure with hypoxia, likely due to alcohol intoxication in combination with sedating medications, now on Valium around-the-clock per pulmonologist's recommendations.  Extubated on 01/15/15 Chest x-ray showed no congestive heart failure or pneumonia, but atelectasis and scarring 3. Hyponatremia, resolved, likely dehydration related , continue low rate IV fluids.  swallow eval.  4. Elevated transaminases, likely alcoholic hepatitis- recheck, stable. 5. Hypomagnesemia supplementing IV- improved.   Management plans discussed with the patient, he is in agreement.   DRUG ALLERGIES: No Known Allergies  CODE STATUS:     Code Status Orders        Start     Ordered   01/14/15 0901  Full code   Continuous     01/14/15 0900      TOTAL TIME TAKING CARE OF THIS PATIENT: 35 minutes.    Altamese Dilling M.D on 01/17/2015 at 9:51 PM  Between 7am to 6pm - Pager - [phone] After 6pm go to [email or web address] - password [email or web address]  [facility]  Office  [phone]  CC: Primary care physician; No primary care provider on file.

## 2015-01-17 NOTE — Progress Notes (Signed)
Pharmacy Consult for Electrolyte Management  No Known Allergies  Patient Measurements: Height:  (167.6 cm) Weight: 128 lb 14.4 oz (58.469 kg) IBW/kg (Calculated) : 63.8  Vital Signs: Temp: 98.2 F (36.8 C) (09/08 0353) Temp Source: Oral (09/08 0353) BP: 141/93 mmHg (09/08 0353) Pulse Rate: 67 (09/08 0353) Intake/Output from previous day: 09/07 0701 - 09/08 0700 In: 2341.7 [I.V.:1921.7; IV Piggyback:420] Out: 3925 [Urine:3925] Intake/Output from this shift: Total I/O In: 1121.7 [I.V.:1121.7] Out: 2600 [Urine:2600]  Labs:  Recent Labs  01/15/15 0337  WBC 6.3  HGB 11.7*  HCT 35.1*  PLT 85*     Recent Labs  01/15/15 0337 01/16/15 0349 01/17/15 0409  NA 140 137 138  K 3.7 3.4* 3.6  CL 110 103 106  CO2 GLUCOSE 90 92 96  BUN CREATININE 0.99 0.87 0.79  CALCIUM 8.4* 8.1* 8.5*  MG 1.6* 1.8 1.5*  PHOS 4.0 3.0 3.5  PROT  --  7.3  --   ALBUMIN  --  3.2*  --   AST  --  84*  --   ALT  --  78*  --   ALKPHOS  --  67  --   BILITOT  --  0.9  --   BILIDIR  --  0.2  --   IBILI  --  0.7  --   TRIG  --   --  74   Estimated Creatinine Clearance: 87.3 mL/min (by C-G formula based on Cr of 0.79).    Recent Labs  01/14/15 0855  GLUCAP 86    Medical History: History reviewed. No pertinent past medical history.  Medications:  Scheduled:  . diazepam  2.5 mg Intravenous QID  . enoxaparin (LOVENOX) injection  40 mg Subcutaneous Q24H  . pantoprazole (PROTONIX) IV  40 mg Intravenous Daily  . sodium chloride  3 mL Intravenous Q12H   Infusions:  . sodium chloride 100 mL/hr at 01/17/15 0525  . propofol (DIPRIVAN) infusion Stopped (01/15/15 0700)   PRN: acetaminophen **OR** acetaminophen, fentaNYL (SUBLIMAZE) injection, LORazepam, menthol-cetylpyridinium, ondansetron **OR** ondansetron (ZOFRAN) IV, senna-docusate  Assessment: Pharmacy consulted to assist in managing electrolytes in this 54 y/o M admitted with acute delirium from alcohol  intoxication.   Plan:  Electrolytes are wnl except for magnesium which is low so will replace with magnesium sulfate 2 g iv once and f/u am labs.   9/7 All WNL except K+ 3.4 and Ca 8.1. KCl 20 mEq x2 doses and calcium carbonate x 2 doses ordered. F/u labs in AM.  Patient is NPO and recently extubated so will change potassium and calcium orders to potassium chloride 10 meq x 3 and calcium gluconate 2 grams x 1 and f/u AM labs.    9/8 AM electrolytes WNL except Mg 1.5. 2 grams magnesium IV ordered. Recheck labs in AM.   Frank Pilger S  01/17/2015,6:22 AM

## 2015-01-18 LAB — CBC
HEMATOCRIT: 38.5 % — AB (ref 40.0–52.0)
HEMOGLOBIN: 13.3 g/dL (ref 13.0–18.0)
MCH: 33.1 pg (ref 26.0–34.0)
MCHC: 34.6 g/dL (ref 32.0–36.0)
MCV: 95.8 fL (ref 80.0–100.0)
Platelets: 120 10*3/uL — ABNORMAL LOW (ref 150–440)
RBC: 4.02 MIL/uL — AB (ref 4.40–5.90)
RDW: 12.8 % (ref 11.5–14.5)
WBC: 7.1 10*3/uL (ref 3.8–10.6)

## 2015-01-18 LAB — PHOSPHORUS: PHOSPHORUS: 4.3 mg/dL (ref 2.5–4.6)

## 2015-01-18 LAB — MAGNESIUM: MAGNESIUM: 2.3 mg/dL (ref 1.7–2.4)

## 2015-01-18 LAB — BASIC METABOLIC PANEL
ANION GAP: 7 (ref 5–15)
BUN: 6 mg/dL (ref 6–20)
CHLORIDE: 105 mmol/L (ref 101–111)
CO2: 26 mmol/L (ref 22–32)
CREATININE: 0.75 mg/dL (ref 0.61–1.24)
Calcium: 8.7 mg/dL — ABNORMAL LOW (ref 8.9–10.3)
GFR calc non Af Amer: >60 mL/min (ref >60)
Glucose, Bld: 100 mg/dL — ABNORMAL HIGH (ref 65–99)
POTASSIUM: 3.9 mmol/L (ref 3.5–5.1)
SODIUM: 138 mmol/L (ref 135–145)

## 2015-01-18 MED ORDER — ENOXAPARIN SODIUM 40 MG/0.4ML ~~LOC~~ SOLN: 40.0000 mg | SUBCUTANEOUS | Status: DC

## 2015-01-18 MED ORDER — PANTOPRAZOLE SODIUM 40 MG PO TBEC
40.0000 mg | DELAYED_RELEASE_TABLET | Freq: Every day | ORAL | Status: DC
  Administered 2015-01-18: 40 mg via ORAL
  Filled 2015-01-18: qty 1

## 2015-01-18 MED ORDER — INFLUENZA VAC SPLIT QUAD 0.5 ML IM SUSY: 0.5000 mL | PREFILLED_SYRINGE | INTRAMUSCULAR | Status: DC

## 2015-01-18 MED ORDER — PNEUMOCOCCAL VAC POLYVALENT 25 MCG/0.5ML IJ INJ: 0.5000 mL | INJECTION | INTRAMUSCULAR | Status: DC

## 2015-01-18 MED ORDER — NEPHROCAPS 1 MG PO CAPS: 1.0000 | ORAL_CAPSULE | Freq: Every day | ORAL | Status: AC

## 2015-01-18 NOTE — Evaluation (Signed)
Physical Therapy Evaluation Patient Details Name: Aaron Roach MRN: [id number] DOB: 1960/08/24 Today's Date: 01/18/2015   History of Present Illness  Pt with ETOH dependency, came in with respiratory distress and needed intubation.  Clinical Impression  Pt has gotten very weak compared to his baseline secondary to not being out of bed in days.  He initially is slow and weak (and remains so compared to his baseline) but is able to circumambulate around the nurses' station and with increased ambulation time/distance he shows improved balance, speed and confidence.  Pt currently lives in a tent and is likely not able to discharge to that setting at this time.  Pt spoke of working on shelter situation with the [place].    Follow Up Recommendations  (not at his baseline, unsure about possibility of further PT)    Equipment Recommendations       Recommendations for Other Services  (would likely benefit from orthotics 2/2 toe contractures)     Precautions / Restrictions Precautions Precautions: Fall Restrictions Weight Bearing Restrictions: No      Mobility  Bed Mobility Overal bed mobility:  (pt in recliner on arrival)                Transfers Overall transfer level: Modified independent Equipment used: Rolling walker (2 wheeled)             General transfer comment: Pt is not reliant on the walker for balance on initially standing up, but did use it as he has not been up much and felt somewhat unsteady  Ambulation/Gait Ambulation/Gait assistance: Min guard Ambulation Distance (Feet): 200 Feet Assistive device: Rolling walker (2 wheeled);None       General Gait Details: ~20 ft with walker, 20 ft with unilateral rail, 140 ft w/o AD.  He initially has some unsteadiness, but after some increased walking time states that he feels it was just from being in bed too long.  Pt with no real LOBs but is slower than normal and does not feel as though he could be safe with his  normal need for ambulation   Stairs            Wheelchair Mobility    Modified Rankin (Stroke Patients Only)       Balance                                             Pertinent Vitals/Pain Pain Assessment:  (chronic stiffness/pain)    Home Living Family/patient expects to be discharged to:: Shelter/Homeless                 Additional Comments: Pt reports [place] - he likley could not navigate that type of terrain at this time    Prior Function Level of Independence: Independent         Comments: 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     Higher education careers adviser        Extremity/Trunk Assessment   Upper Extremity Assessment: Generalized weakness (limitations L>R with chronically tight tendons, poor grip)           Lower Extremity Assessment:  (functional strength with tendon tightness issues b/l )         Communication      Cognition Arousal/Alertness: Awake/alert Behavior During Therapy:  WFL for tasks assessed/performed Overall Cognitive Status: Within Functional Limits for tasks assessed                      General Comments      Exercises        Assessment/Plan    PT Assessment Patient needs continued PT services  PT Diagnosis Difficulty walking;Generalized weakness   PT Problem List Decreased strength;Decreased range of motion;Decreased balance;Decreased mobility;Decreased activity tolerance;Decreased coordination;Decreased safety awareness  PT Treatment Interventions Gait training;Functional mobility training;Stair training;Therapeutic activities;Therapeutic exercise;Balance training   PT Goals (Current goals can be found in the Care Plan section) Acute Rehab PT Goals Patient Stated Goal: Pt would like to get situation sorted out at the [place] PT Goal Formulation: With patient Time For Goal Achievement: 01/25/15    Frequency Min 2X/week   Barriers  to discharge        Co-evaluation               End of Session Equipment Utilized During Treatment: Gait belt Activity Tolerance: Patient tolerated treatment well   Nurse Communication: Mobility status         Time: 4098-1191 PT Time Calculation (min) (ACUTE ONLY): 19 min   Charges:   PT Evaluation $Initial PT Evaluation Tier I: 1 Procedure     PT G Codes:       Loran Senters, PT, DPT [phone]  Malachi Pro 01/18/2015, 5:03 PM

## 2015-01-18 NOTE — Progress Notes (Signed)
Spoke with Dr. Elisabeth Pigeon about d/c valium, okay with keeping prn ativan and stopping scheduled valium. Also ordered for protonix to be changed to PO. Trudee Kuster

## 2015-01-18 NOTE — Care Management (Signed)
Physical therapy has verbalized concern that patient because of his weakness from recent illness, he may have trouble navigating [place] his tent.  He is agreeable to go to the shelter and says he has a friend that will transport him.  Informed CSW of need to contact shelter.  She will provide transportation if needed.

## 2015-01-18 NOTE — Clinical Social Work Note (Signed)
CSW spoke to [place] again today to ask for additional paperwork needed for pt to complete is registration with the [facility].  Spoke to [employer] yesterday and Onalee Hua ext [phone] today.  Onalee Hua stated that he would fax forms to CSW later today.  CSW will check again for docs today.

## 2015-01-18 NOTE — Progress Notes (Signed)
Pharmacy Consult for Electrolyte Management  No Known Allergies  Patient Measurements: Height:  (167.6 cm) Weight: 126 lb 3.2 oz (57.244 kg) IBW/kg (Calculated) : 63.8  Vital Signs: Temp: 97.9 F (36.6 C) (09/09 0500) Temp Source: Oral (09/09 0500) BP: 134/84 mmHg (09/09 0500) Pulse Rate: 61 (09/09 0500) Intake/Output from previous day: 09/08 0701 - 09/09 0700 In: 1808.3 [P.O.:480; I.V.:1278.3; IV Piggyback:50] Out: 4100 [Urine:4100] Intake/Output from this shift: Total I/O In: 50 [IV Piggyback:50] Out: 400 [Urine:400]  Labs:  Recent Labs  01/18/15 0421  WBC 7.1  HGB 13.3  HCT 38.5*  PLT 120*     Recent Labs  01/16/15 0349 01/17/15 0409 01/18/15 0421  NA 137 138 138  K 3.4* 3.6 3.9  CL 103 106 105  CO2 GLUCOSE 92 96 100*  BUN CREATININE 0.87 0.79 0.75  CALCIUM 8.1* 8.5* 8.7*  MG 1.8 1.5* 2.3  PHOS 3.0 3.5 4.3  PROT 7.3  --   --   ALBUMIN 3.2*  --   --   AST 84*  --   --   ALT 78*  --   --   ALKPHOS 67  --   --   BILITOT 0.9  --   --   BILIDIR 0.2  --   --   IBILI 0.7  --   --   TRIG  --  74  --    Estimated Creatinine Clearance: 85.4 mL/min (by C-G formula based on Cr of 0.75).   No results for input(Roach): GLUCAP in the last 72 hours.  Medical History: History reviewed. No pertinent past medical history.  Medications:  Scheduled:  . diazepam  2.5 mg Intravenous QID  . enoxaparin (LOVENOX) injection  40 mg Subcutaneous Q24H  . pantoprazole (PROTONIX) IV  40 mg Intravenous Daily  . sodium chloride  3 mL Intravenous Q12H   Infusions:  . sodium chloride 100 mL/hr at 01/18/15 0101   PRN: acetaminophen **OR** acetaminophen, fentaNYL (SUBLIMAZE) injection, LORazepam, menthol-cetylpyridinium, ondansetron **OR** ondansetron (ZOFRAN) IV, senna-docusate  Assessment: Pharmacy consulted to assist in managing electrolytes in this 54 y/o M admitted with acute delirium from alcohol intoxication.   Plan:  Electrolytes are wnl  except for magnesium which is low so will replace with magnesium sulfate 2 g iv once and f/u am labs.   9/7 All WNL except K+ 3.4 and Ca 8.1. KCl 20 mEq x2 doses and calcium carbonate x 2 doses ordered. F/u labs in AM.  Patient is NPO and recently extubated so will change potassium and calcium orders to potassium chloride 10 meq x 3 and calcium gluconate 2 grams x 1 and f/u AM labs.    9/8 AM electrolytes WNL except Mg 1.5. 2 grams magnesium IV ordered. Recheck labs in AM.  9/9 AM electrolytes WNL. Recheck in AM   Aaron Roach  01/18/2015,5:43 AM

## 2015-01-18 NOTE — Progress Notes (Signed)
Walked with patient per Dr. Elisabeth Pigeon order. Patient very weak and unsteady. Dr. Elisabeth Pigeon notified and PT ordered. Trudee Kuster

## 2015-01-18 NOTE — Clinical Social Work Note (Signed)
CSW received document from [place] and left with pt.  Per pt he is going to go to [facility].  CSW advised pt to bring this form with him so he can complete his registration in person.  CSW will continue to follow

## 2015-01-18 NOTE — Discharge Summary (Signed)
[facility] - [place] at [facility]   PATIENT NAME: Pinchos Topel    MR#:  [id number]  DATE OF BIRTH:  01-01-1961  DATE OF ADMISSION:  01/14/2015 ADMITTING PHYSICIAN: Oralia Manis, MD  DATE OF DISCHARGE: 01/18/2015  PRIMARY CARE PHYSICIAN: No primary care provider on file.    ADMISSION DIAGNOSIS:  Apnea [R06.81] Acute respiratory failure with hypoxia [J96.01] Alcohol intoxication, with unspecified complication [F10.129] Hypotension, unspecified hypotension type [I95.9]  DISCHARGE DIAGNOSIS:  Principal Problem:   Acute respiratory failure with hypoxemia Active Problems:   Alcohol intoxication   Acute delirium   SECONDARY DIAGNOSIS:  History reviewed. No pertinent past medical history.  HOSPITAL COURSE:   1. Acute delirium due to alcohol intoxication, now likely for DTs, supportive therapy, ammonia level is normal , given Valium around-the-clock following mental status  improved significantly- so changed now just to PRN meds.  He complained of generalized weakness- called PT eval. 2. Acute respiratory failure with hypoxia, likely due to alcohol intoxication in combination with sedating medications, on Valium around-the-clock per pulmonologist's recommendations.  Extubated on 01/15/15 Chest x-ray showed no congestive heart failure or pneumonia, but atelectasis and scarring 3. Hyponatremia, resolved, likely dehydration related , continue low rate IV fluids. swallow eval.  4. Elevated transaminases, likely alcoholic hepatitis- recheck, stable. 5. Hypomagnesemia supplementing IV- improved. 6. Generalized weakness- Likely due to not moving much for few days.  He need to be ambulating over the weekend, with nursing or PT staff- to help him improve indurance- as he may not be able to get any placement.  Later case manager called me, social worker could not get anyone at shelter. But she spoke to the pt- and he called his friend- he agreed to take  him to his house. So if he comes up to pick him up- we will discharge.  DISCHARGE CONDITIONS:   Stable.  CONSULTS OBTAINED:  Treatment Team:  Altamese Dilling, MD  DRUG ALLERGIES:  No Known Allergies  DISCHARGE MEDICATIONS:   Current Discharge Medication List    START taking these medications   Details  b complex-C-folic acid 1 MG capsule Take 1 capsule (1 mg total) by mouth daily. Qty: 100 capsule, Refills: 0         DISCHARGE INSTRUCTIONS:    Do Not Drink alcohol.  If you experience worsening of your admission symptoms, develop shortness of breath, life threatening emergency, suicidal or homicidal thoughts you must seek medical attention immediately by calling 911 or calling your MD immediately  if symptoms less severe.  You Must read complete instructions/literature along with all the possible adverse reactions/side effects for all the Medicines you take and that have been prescribed to you. Take any new Medicines after you have completely understood and accept all the possible adverse reactions/side effects.   Please note  You were cared for by a hospitalist during your hospital stay. If you have any questions about your discharge medications or the care you received while you were in the hospital after you are discharged, you can call the unit and asked to speak with the hospitalist on call if the hospitalist that took care of you is not available. Once you are discharged, your primary care physician will handle any further medical issues. Please note that NO REFILLS for any discharge medications will be authorized once you are discharged, as it is imperative that you return to your primary care physician (or establish a relationship with a primary care physician if you do not have one)  for your aftercare needs so that they can reassess your need for medications and monitor your lab values.    Today   CHIEF COMPLAINT:   Chief Complaint  Patient presents with   . Fall    Pt. here via EMS from home for a fall laceration to top of head.    HISTORY OF PRESENT ILLNESS:  Vasily Fedewa  is a 54 y.o. male who presents with alcohol intoxication, and subsequent fall. Patient is currently intubated on this interviewer's examination is unable to contribute to the history of present illness. Collateral information is taken from ED physician and chart review. Patient arrived ED intoxicated, brought by friends who were concerned because he was stumbling and falling a lot, and on one fall hit his head and sustained an abrasion. On evaluation in the ED his alcohol level was found to be in the 400s. CT head and C-spine and x-ray of the lumbar spine all showed no acute trauma or fracture and no intracranial abnormality. However, patient was combative at the time of the exam and was given reasonable low doses of Ativan (0.5 mg IV 2) and Haldol 5 mg IV 1. After returning from the radiology Department patient became lethargic and then began to drop his O2 sats and began to have apneic episodes. Patient was intubated and hospitalists were called for admission for acute respiratory failure.   VITAL SIGNS:  Blood pressure 134/91, pulse 69, temperature 97.8 F (36.6 C), temperature source Oral, resp. rate 20, height  (1.676 m), weight 57.244 kg (126 lb 3.2 oz), SpO2 99 %.  I/O:   Intake/Output Summary (Last 24 hours) at 01/18/15 1756 Last data filed at 01/18/15 1733  Gross per 24 hour  Intake 1868.33 ml  Output   4025 ml  Net -2156.67 ml    PHYSICAL EXAMINATION:   GENERAL: 54 y.o.-year-old patient lying in the bed in no distress, alert. EYES: Pupils equal, round, reactive to light. No scleral icterus.  HEENT: Head reveals laceration on the vertex, no bleeding, normocephalic. Oropharynx clear.  NECK: Supple, no jugular venous distention. No thyroid enlargement, no tenderness.  LUNGS: Slightly diminished breath sounds bilaterally, no wheezing, bilateral  rales,rhonchi in all lung fields but no crepitations.  CARDIOVASCULAR: S1, S2 normal. No murmurs, rubs, or gallops.  ABDOMEN: Soft, nontender, nondistended. Bowel sounds present. No organomegaly or mass.  EXTREMITIES: No pedal edema, cyanosis, or clubbing.  NEUROLOGIC: Cranial nerves intact, Follows commands, no tremers, sensation intact, power 4/5 all 4 limbs. PSYCHIATRIC: alert and oriented.  SKIN: No obvious rash, lesion, or ulcer.    DATA REVIEW:   CBC  Recent Labs Lab 01/18/15 0421  WBC 7.1  HGB 13.3  HCT 38.5*  PLT 120*    Chemistries   Recent Labs Lab 01/16/15 0349  01/18/15 0421  NA 137  < > 138  K 3.4*  < > 3.9  CL 103  < > 105  CO2 28  < > 26  GLUCOSE 92  < > 100*  BUN 9  < > 6  CREATININE 0.87  < > 0.75  CALCIUM 8.1*  < > 8.7*  MG 1.8  < > 2.3  AST 84*  --   --   ALT 78*  --   --   ALKPHOS 67  --   --   BILITOT 0.9  --   --   < > = values in this interval not displayed.  Cardiac Enzymes No results for input(s): TROPONINI in the last  168 hours.  Microbiology Results  Results for orders placed or performed during the hospital encounter of 01/14/15  MRSA PCR Screening     Status: None   Collection Time: 01/14/15  9:00 AM  Result Value Ref Range Status   MRSA by PCR NEGATIVE NEGATIVE Final    Comment:        The GeneXpert MRSA Assay (FDA approved for NASAL specimens only), is one component of a comprehensive MRSA colonization surveillance program. It is not intended to diagnose MRSA infection nor to guide or monitor treatment for MRSA infections.     RADIOLOGY:  No results found.  EKG:  No orders found for this or any previous visit.    Management plans discussed with the patient, family and they are in agreement.  CODE STATUS:     Code Status Orders        Start     Ordered   01/14/15 0901  Full code   Continuous     01/14/15 0900      TOTAL TIME TAKING CARE OF THIS PATIENT:40 minutes.    Altamese Dilling  M.D on 01/18/2015 at 5:56 PM  Between 7am to 6pm - Pager - [phone]  After 6pm go to [email or web address] - password [email or web address] [facility]  [facility]  Office  [phone]  CC: Primary care physician; No primary care provider on file.

## 2015-01-18 NOTE — Care Management (Signed)
Informed by primary nurse that patient is for discharge home today.  Informed that patient is not able to get out of bed by himself.  PT consult is present.  Obtained some housing assisting contact and [facility] transportation contacts for patient.  Patient  Has lived in a tent for 2 years and it had been anticipated that he would return to his current living environment which may be of concern if he is not able to walk.  Informed that patient will not be discharged on  Any meds- his ativan and valium have been discontinued.  Patient has stated that he will have transporationt back to his camp site.  He verbalizes that he is not going to drink anymore and resources were to be given to him by CSW according to note

## 2015-01-18 NOTE — Care Management (Signed)
Discussed with patient that he have his friend bring in his cell phone to enable patient to call his friends for assist with a place to stay.  Patient spoke with his friend Gwynneth Munson and he .   has offered to transport patient to the home of his friend AJ.  Stressed to [employer] the importance of getting patient to the [facility].  Butch assures CM that patient will obtain his vitamins.  Butch says he will make sure patient gets to the [facility].

## 2015-01-18 NOTE — Plan of Care (Signed)
Problem: Phase I Progression Outcomes Goal: Pain controlled with appropriate interventions Outcome: Not Applicable Date Met:  19/41/74 Denies pain Goal: OOB as tolerated unless otherwise ordered Outcome: Completed/Met Date Met:  01/18/15 Stood at bedside to void with 1 assist.  Encouraged up to chair for meals today.  Problem: Phase III Progression Outcomes Goal: Pain controlled on oral analgesia Outcome: Not Applicable Date Met:  12/23/46 No voiced complaints of pain

## 2015-01-18 NOTE — Discharge Instructions (Signed)
TRANSPORTATION TO THE [facility]---MUST GivE A 48 HOUR NOTICE..... MUST HAVE A PHONE SO AGENCY CAN CALL TO SET UP AND CONFIRM.  [phone]- to the [place]  [facility]- through [place]- (HOUSING ASSISTANCE CONTACT)   [phone]  Extension  [phone]

## 2015-01-18 NOTE — Progress Notes (Signed)
Patient alert and oriented x4, no complaints at this time. vss at this time. Patient SB on telemetry. Will continue to assess. Aaron Roach R [place]   

## 2015-01-18 NOTE — Progress Notes (Signed)
Patient d/c'd self care. Education provided, no questions at this time. Patient to be picked up by family friends. Telemetry removed. Trudee Kuster

## 2015-01-18 NOTE — Care Management (Signed)
Only discharge script will be for [facility].  Provided patient assist to obtain the vitamins

## 2015-01-18 NOTE — Progress Notes (Addendum)
[facility] - [place] at [facility]   PATIENT NAME: Aaron Roach    MR#:  [id number]  DATE OF BIRTH:  04/25/1961  SUBJECTIVE: generalized weakness.  CHIEF COMPLAINT:   Chief Complaint  Patient presents with  . Fall    Pt. here via EMS from home for a fall laceration to top of head.   patient is 54 year old Caucasian male with history of alcohol abuse who was brought by EMS for fall and laceration of the top of his head. CT scan of the head and neck were unremarkable. As patient was agitated. He received a few small doses of Ativan as well as Haldol and became lethargic, hypoxic , apneic and was intubated.  Extubated on 01/15/15- On round the clock valium IV.  More alert now. No agitation. He feels generalized weakness.  Review of Systems  Constitutional: Negative for fever, chills and weight loss.  HENT: Negative for hearing loss.   Eyes: Negative for blurred vision.  Respiratory: Negative for cough, shortness of breath and wheezing.   Cardiovascular: Negative for chest pain and palpitations.  Gastrointestinal: Negative for nausea, vomiting and abdominal pain.  Genitourinary: Negative for dysuria, urgency and frequency.  Skin: Negative for rash.  Neurological: Positive for weakness. Negative for dizziness, tremors, focal weakness and headaches.    VITAL SIGNS: Blood pressure 134/91, pulse 69, temperature 97.8 F (36.6 C), temperature source Oral, resp. rate 20, height  (1.676 m), weight 57.244 kg (126 lb 3.2 oz), SpO2 99 %.  PHYSICAL EXAMINATION:   GENERAL:  54 y.o.-year-old patient lying in the bed in no distress, alert. EYES: Pupils equal, round, reactive to light. No scleral icterus.  HEENT: Head reveals laceration on the vertex, no bleeding,  normocephalic. Oropharynx clear.  NECK:  Supple, no jugular venous distention. No thyroid enlargement, no tenderness.  LUNGS: Slightly diminished breath sounds bilaterally, no wheezing, bilateral  rales,rhonchi  in all lung fields but no crepitations.  CARDIOVASCULAR: S1, S2 normal. No murmurs, rubs, or gallops.  ABDOMEN: Soft, nontender, nondistended. Bowel sounds present. No organomegaly or mass.  EXTREMITIES: No pedal edema, cyanosis, or clubbing.  NEUROLOGIC:  Cranial nerves intact,  Follows commands, no tremers, sensation intact, power 4/5 all 4 limbs. PSYCHIATRIC: alert and oriented.  SKIN: No obvious rash, lesion, or ulcer.   ORDERS/RESULTS REVIEWED:   CBC  Recent Labs Lab 01/14/15 0230 01/15/15 0337 01/18/15 0421  WBC 8.4 6.3 7.1  HGB 14.1 11.7* 13.3  HCT 40.3 35.1* 38.5*  PLT 131* 85* 120*  MCV 95.0 96.3 95.8  MCH 33.2 32.2 33.1  MCHC 35.0 33.4 34.6  RDW 12.5 12.8 12.8  LYMPHSABS 3.3  --   --   MONOABS 0.8  --   --   EOSABS 0.4  --   --   BASOSABS 0.1  --   --    ------------------------------------------------------------------------------------------------------------------  Chemistries   Recent Labs Lab 01/14/15 0230 01/15/15 0337 01/16/15 0349 01/17/15 0409 01/18/15 0421  NA 134* 140 137 138 138  K 4.0 3.7 3.4* 3.6 3.9  CL 103 110 103 106 105  CO2 GLUCOSE 105* 90 92 96 100*  BUN CREATININE 1.09 0.99 0.87 0.79 0.75  CALCIUM 8.5* 8.4* 8.1* 8.5* 8.7*  MG 2.1 1.6* 1.8 1.5* 2.3  AST 77*  --  84*  --   --   ALT 96*  --  78*  --   --   ALKPHOS 135*  --  67  --   --   BILITOT 0.3  --  0.9  --   --    ------------------------------------------------------------------------------------------------------------------ estimated creatinine clearance is 85.4 mL/min (by C-G formula based on Cr of 0.75). ------------------------------------------------------------------------------------------------------------------ No results for input(s): TSH, T4TOTAL, T3FREE, THYROIDAB in the last 72 hours.  Invalid input(s): FREET3  Cardiac Enzymes No results for input(s): CKMB, TROPONINI, MYOGLOBIN in the last 168 hours.  Invalid  input(s): CK ------------------------------------------------------------------------------------------------------------------ Invalid input(s): POCBNP ---------------------------------------------------------------------------------------------------------------  RADIOLOGY: No results found.  EKG: No orders found for this or any previous visit.  ASSESSMENT AND PLAN:  1. Acute delirium  due to alcohol intoxication, now likely for DTs, supportive therapy, ammonia level is normal , given Valium around-the-clock following mental status   improved significantly- so changed now just to PRN meds.   He complained of generalized weakness- called PT eval. 2. Acute respiratory failure with hypoxia, likely due to alcohol intoxication in combination with sedating medications,  on Valium around-the-clock per pulmonologist's recommendations.  Extubated on 01/15/15 Chest x-ray showed no congestive heart failure or pneumonia, but atelectasis and scarring 3. Hyponatremia, resolved, likely dehydration related , continue low rate IV fluids.  swallow eval.  4. Elevated transaminases, likely alcoholic hepatitis- recheck, stable. 5. Hypomagnesemia supplementing IV- improved. 6. Generalized weakness- Likely due to not moving much for few days.   He need to be ambulating over the weekend, with nursing or PT staff- to help him improve indurance- as he may not be able to get any placement.  Management plans discussed with the patient, he is in agreement.   DRUG ALLERGIES: No Known Allergies  CODE STATUS:     Code Status Orders        Start     Ordered   01/14/15 0901  Full code   Continuous     01/14/15 0900      TOTAL TIME TAKING CARE OF THIS PATIENT: 35 minutes.    Altamese Dilling M.D on 01/18/2015 at 5:23 PM  Between 7am to 6pm - Pager - [phone] After 6pm go to [email or web address] - password [email or web address] [facility]  [facility]  Office  [phone]  CC: Primary care physician;  No primary care provider on file.

## 2016-01-12 IMAGING — CT CT CERVICAL SPINE W/O CM
1 of 6 series · 2 of 14 positions shown, 3 images · non-contrast
Comparison: None.

CLINICAL DATA: Fall with head injury.  Intoxicated.

EXAM:
CT HEAD WITHOUT CONTRAST
CT CERVICAL SPINE WITHOUT CONTRAST
TECHNIQUE: Multidetector CT imaging of the head and cervical spine was
performed following the standard protocol without intravenous
contrast. Multiplanar CT image reconstructions of the cervical spine
were also generated.

[Series 14: orthogonal axials · axial · 0.29mm/px · z∈[-339,-283]mm · 2 of 92 slices shown, 3 images]
[im 31/92  soft-tissue]
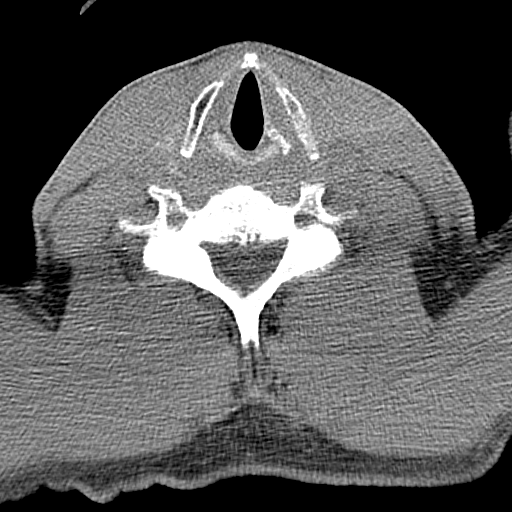
[im 31/92  bone]
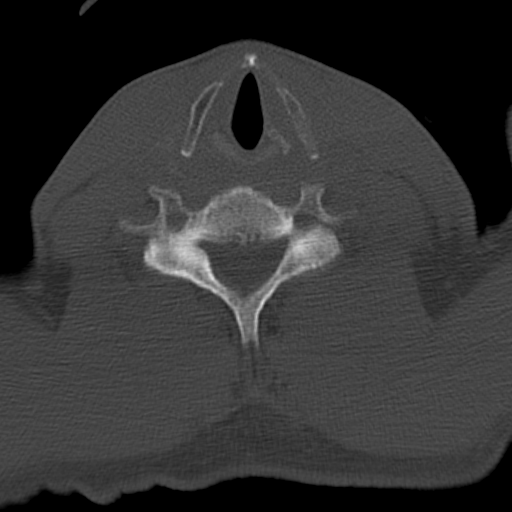
[im 61/92  bone]
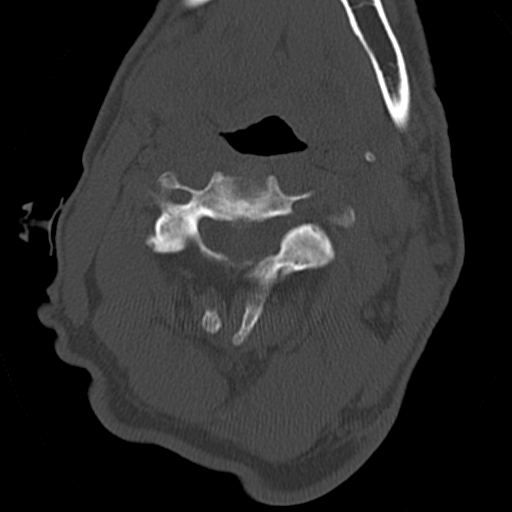

[2 of 14 positions shown; findings below may reference images not displayed]

FINDINGS: CT HEAD FINDINGS

Examination is technically limited due to motion artifact.
Ventricles and sulci appear symmetrical. No mass effect or midline
shift. No abnormal extra-axial fluid collections. Gray-white matter
junctions are distinct. Basal cisterns are not effaced. No evidence
of acute intracranial hemorrhage. No depressed skull fractures. Near
complete opacification of the right maxillary antrum.

CT CERVICAL SPINE FINDINGS

Examination is limited due to motion artifact. Normal alignment of
the cervical spine. Degenerative changes with narrowed interspaces
and endplate hypertrophic changes predominantly at C3-4, C4-5, and
C5-6 levels. No vertebral compression deformities. No prevertebral
soft tissue swelling. C1-2 articulation appears intact. No focal
bone lesion or bone destruction. Bone cortex and trabecular
architecture appear intact. Vascular calcifications in the cervical
carotid arteries.
IMPRESSION: No acute intracranial abnormalities. Opacification of right
maxillary antrum.

Normal alignment of the cervical spine. Degenerative changes. No
acute displaced fractures identified.
